# Patient Record
Sex: Female | Born: 1985 | Race: White | Hispanic: No | Marital: Married | Smoking: Never smoker
Health system: Southern US, Academic
[De-identification: ages and names within clinical notes are randomized; demographics above are authoritative.]

## PROBLEM LIST (undated history)

## (undated) ENCOUNTER — Emergency Department (HOSPITAL_BASED_OUTPATIENT_CLINIC_OR_DEPARTMENT_OTHER): Admission: EM | Payer: Self-pay | Source: Home / Self Care

## (undated) DIAGNOSIS — G43909 Migraine, unspecified, not intractable, without status migrainosus: Secondary | ICD-10-CM

## (undated) DIAGNOSIS — I10 Essential (primary) hypertension: Secondary | ICD-10-CM

## (undated) DIAGNOSIS — K5090 Crohn's disease, unspecified, without complications: Secondary | ICD-10-CM

## (undated) HISTORY — PX: HX HYSTERECTOMY: SHX81

---
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
## 2017-05-26 ENCOUNTER — Telehealth (HOSPITAL_BASED_OUTPATIENT_CLINIC_OR_DEPARTMENT_OTHER): Payer: Self-pay | Admitting: Psychiatry

## 2018-01-06 DIAGNOSIS — K5190 Ulcerative colitis, unspecified, without complications: Secondary | ICD-10-CM | POA: Insufficient documentation

## 2019-05-10 DIAGNOSIS — Z780 Asymptomatic menopausal state: Secondary | ICD-10-CM | POA: Insufficient documentation

## 2020-12-24 IMAGING — CR XRAY HAND MINIMUM 3 VIEWS LT
1 series · 3 of 3 positions shown · non-contrast
Comparison: None.

﻿EXAM:  79099      XRAY HAND MINIMUM 3 VIEWS LT
INDICATION: Stiffness of middle and ring fingers.  No history of trauma.

[Series 1: view not recorded · 0.17mm/px · 3 of 3 slices shown]
[im 1/3]
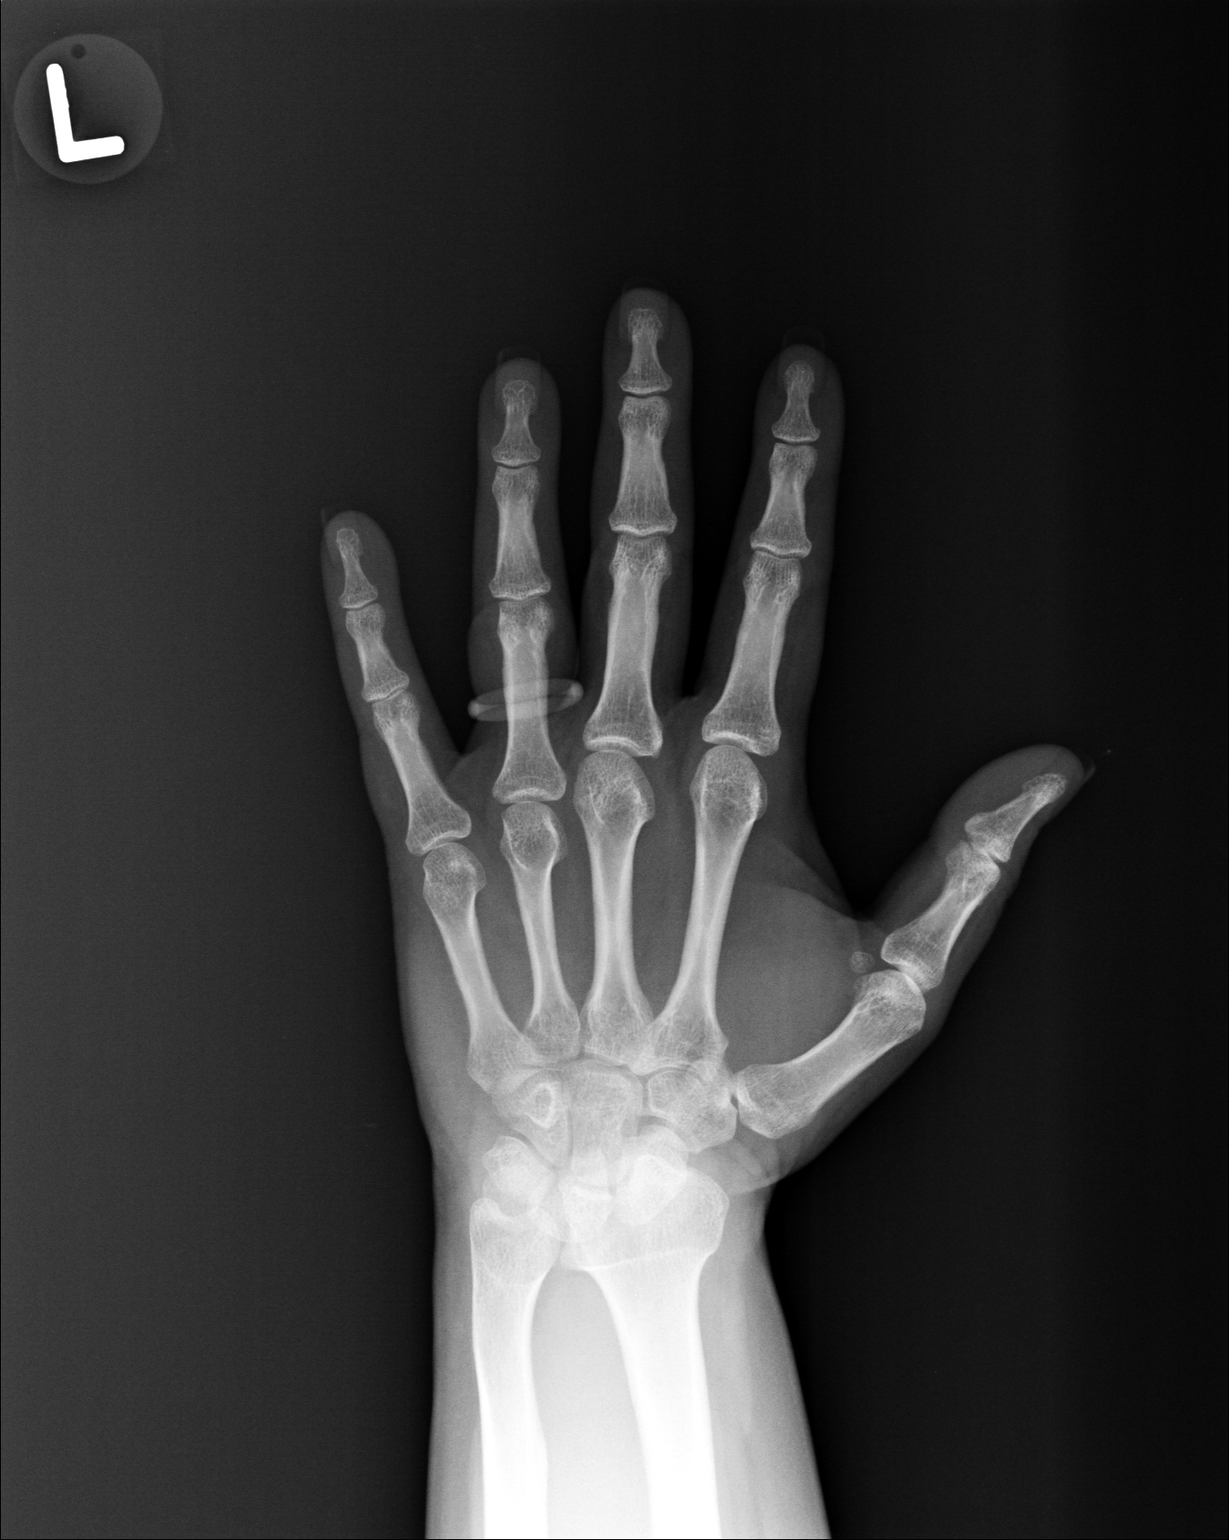
[im 2/3]
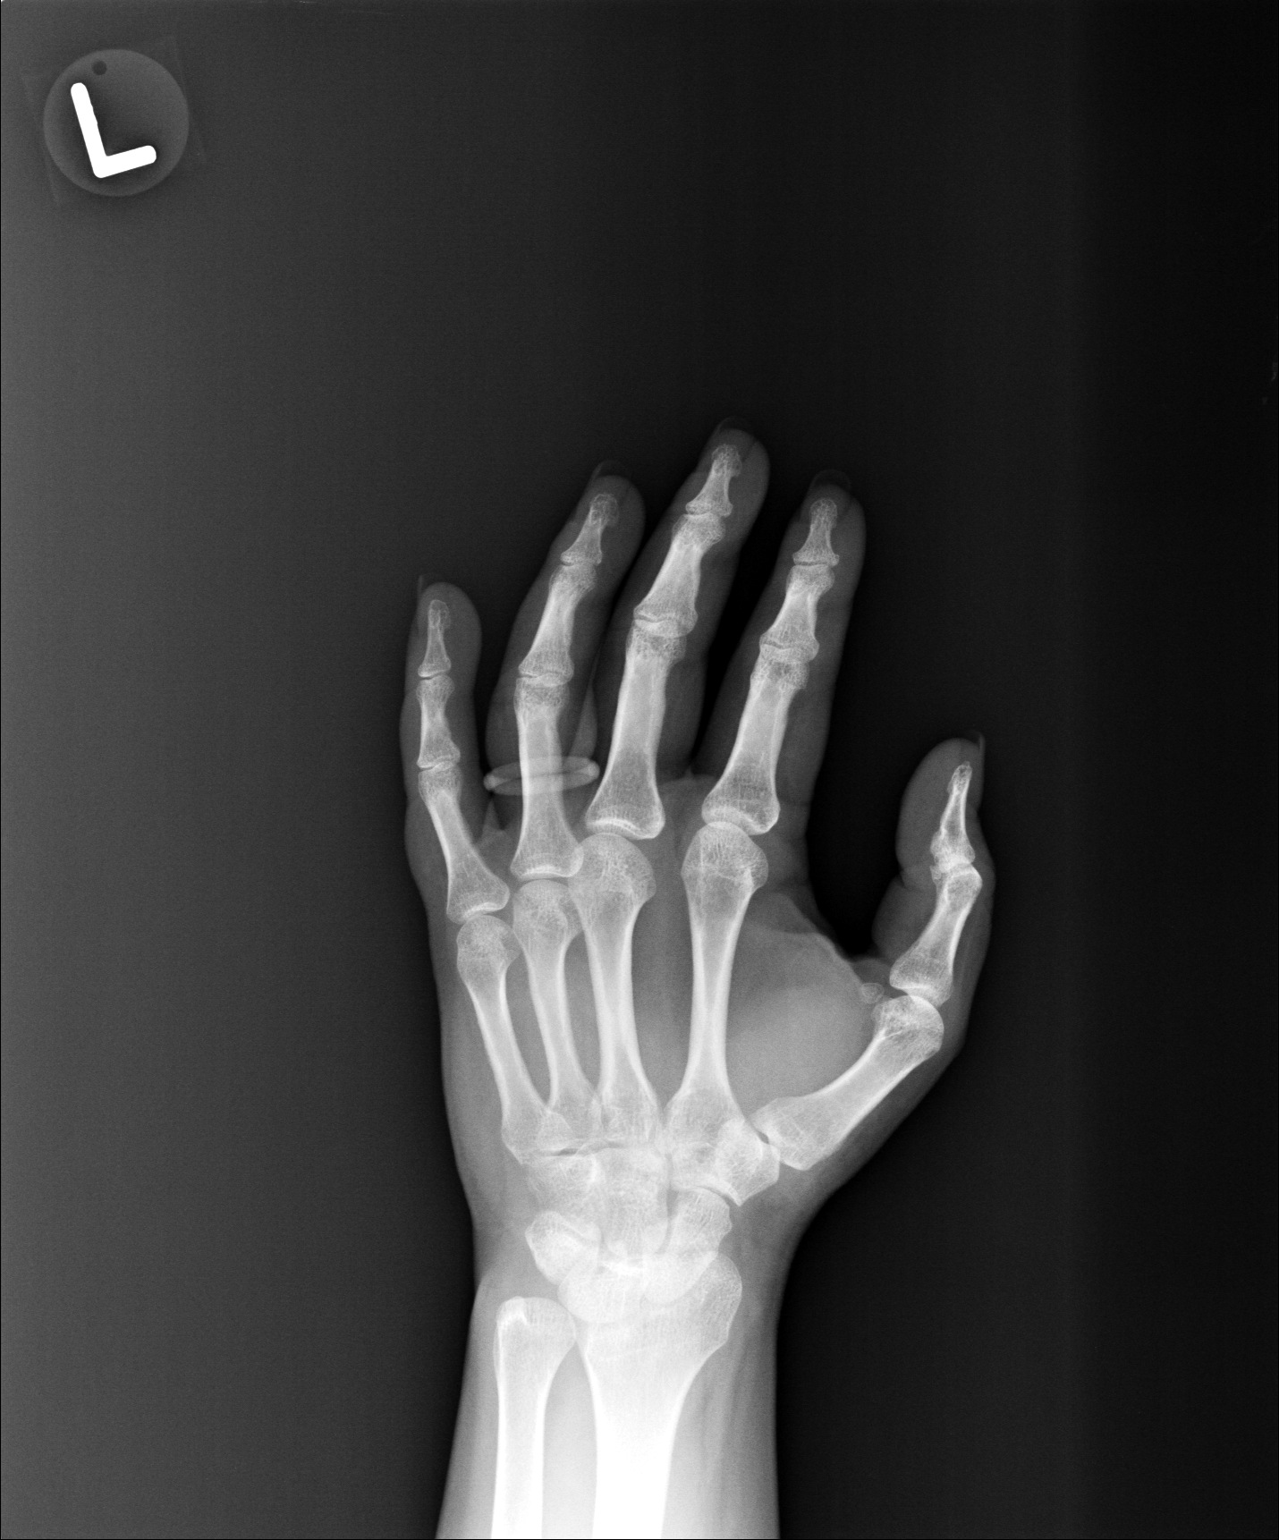
[im 3/3]
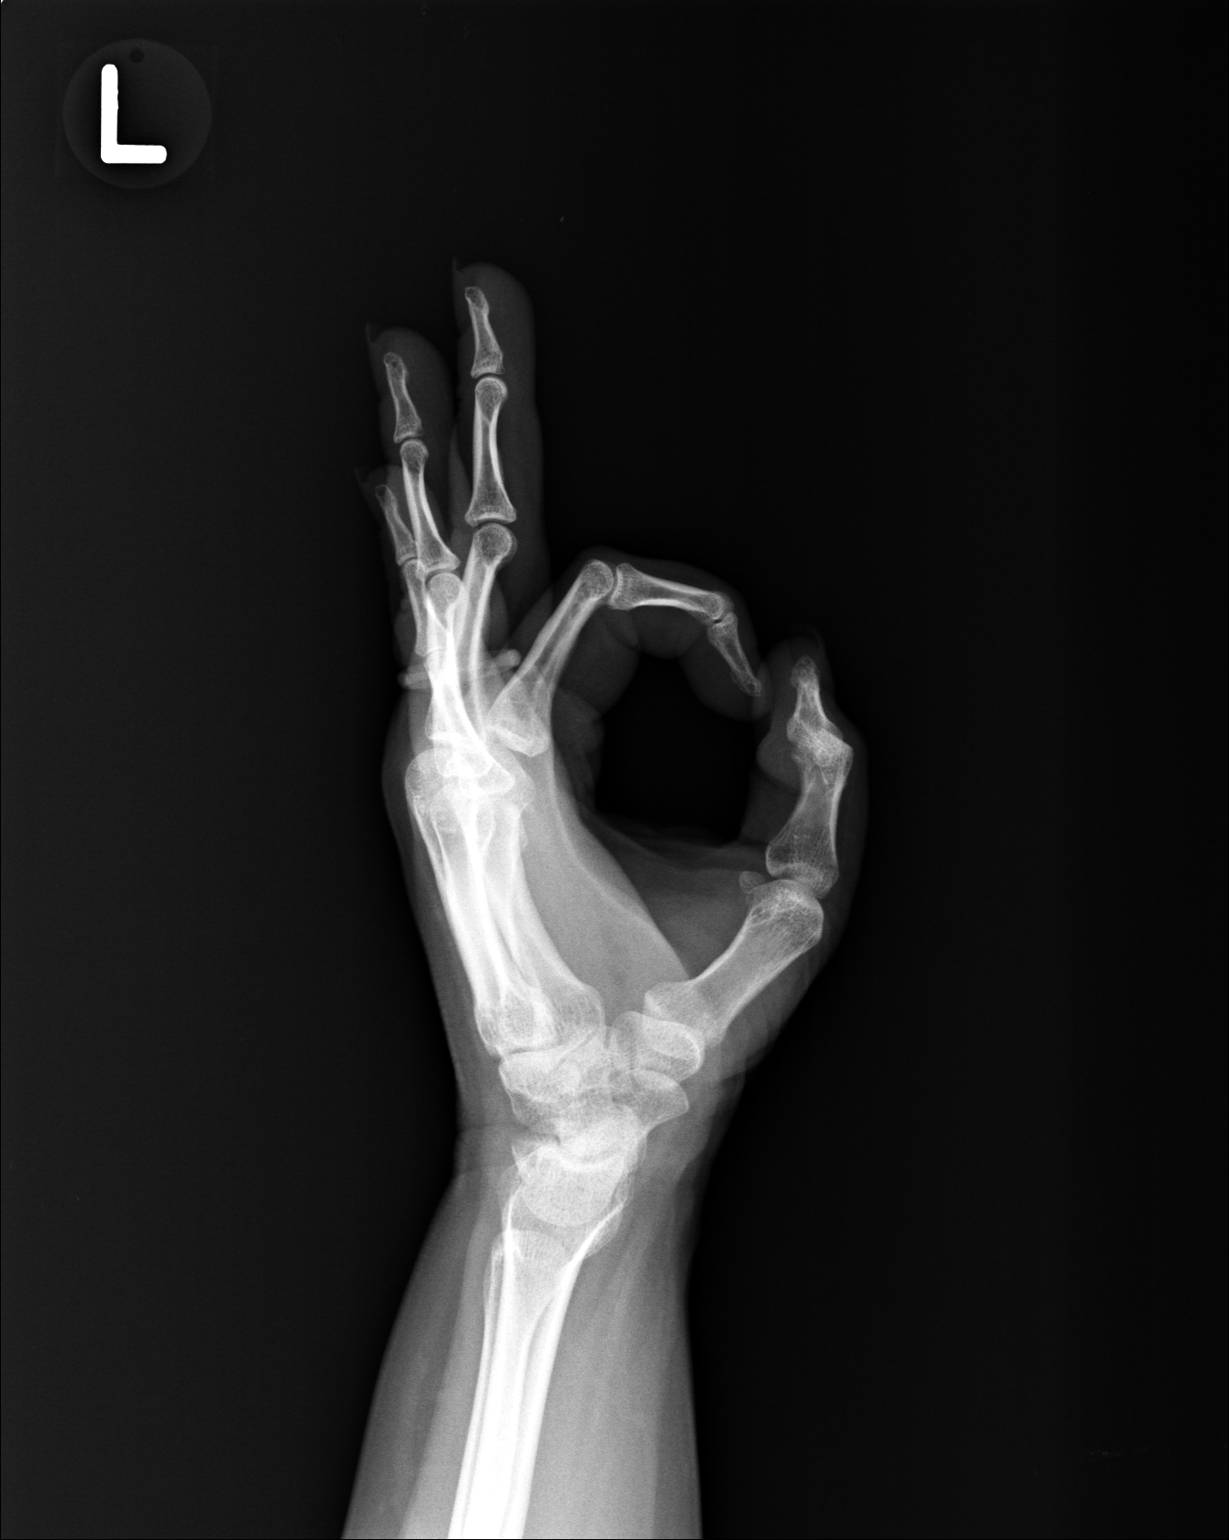

[3 of 3 positions shown; findings below may reference images not displayed]

FINDINGS: No acute bony lesions are seen.  No radiographic evidence of rheumatoid arthritis.  The interphalangeal joints are normal.  Soft tissues are unremarkable.
IMPRESSION: No significant plain radiographic abnormalities of right hand particularly fingers.  Interphalangeal joints are normal.

## 2021-09-28 ENCOUNTER — Encounter (HOSPITAL_BASED_OUTPATIENT_CLINIC_OR_DEPARTMENT_OTHER): Payer: Self-pay

## 2021-09-28 ENCOUNTER — Emergency Department
Admission: EM | Admit: 2021-09-28 | Discharge: 2021-09-28 | Disposition: A | Discharge status: Home / Self Care | Payer: Medicaid Other | Attending: Family | Admitting: Family

## 2021-09-28 ENCOUNTER — Other Ambulatory Visit: Payer: Self-pay

## 2021-09-28 ENCOUNTER — Emergency Department (EMERGENCY_DEPARTMENT_HOSPITAL): Payer: Medicaid Other

## 2021-09-28 DIAGNOSIS — R300 Dysuria: Secondary | ICD-10-CM | POA: Insufficient documentation

## 2021-09-28 DIAGNOSIS — R160 Hepatomegaly, not elsewhere classified: Secondary | ICD-10-CM | POA: Insufficient documentation

## 2021-09-28 DIAGNOSIS — Z6835 Body mass index (BMI) 35.0-35.9, adult: Secondary | ICD-10-CM

## 2021-09-28 DIAGNOSIS — R109 Unspecified abdominal pain: Secondary | ICD-10-CM | POA: Insufficient documentation

## 2021-09-28 HISTORY — DX: Migraine, unspecified, not intractable, without status migrainosus: G43.909

## 2021-09-28 HISTORY — DX: Essential (primary) hypertension: I10

## 2021-09-28 HISTORY — DX: Crohn's disease, unspecified, without complications: K50.90

## 2021-09-28 LAB — URINALYSIS, MACRO/MICRO
BILIRUBIN: NEGATIVE mg/dL
BLOOD: NEGATIVE mg/dL
GLUCOSE: NEGATIVE mg/dL
KETONES: NEGATIVE mg/dL
LEUKOCYTES: NEGATIVE WBCs/uL
NITRITE: NEGATIVE
PH: 6 (ref 4.6–8.0)
PROTEIN: NEGATIVE mg/dL
SPECIFIC GRAVITY: <=1.005 (ref 1.003–1.035)
UROBILINOGEN: 0.2 mg/dL (ref 0.2–1.0)

## 2021-09-28 LAB — CBC WITH DIFF
BASOPHIL #: 0.03 10*3/uL (ref 0.00–2.50)
BASOPHIL %: 0 % (ref 0–3)
EOSINOPHIL #: 0.16 10*3/uL (ref 0.00–2.40)
EOSINOPHIL %: 2 % (ref 0–7)
HCT: 41.4 % (ref 37.0–47.0)
HGB: 14.7 g/dL (ref 12.5–16.0)
LYMPHOCYTE #: 2.09 10*3/uL — ABNORMAL LOW (ref 2.10–11.00)
LYMPHOCYTE %: 24 % — ABNORMAL LOW (ref 25–45)
MCH: 32.1 pg — ABNORMAL HIGH (ref 27.0–32.0)
MCHC: 35.5 g/dL (ref 32.0–36.0)
MCV: 90.7 fL (ref 78.0–99.0)
MONOCYTE #: 0.42 10*3/uL (ref 0.00–4.10)
MONOCYTE %: 5 % (ref 0–12)
MPV: 7.2 fL — ABNORMAL LOW (ref 7.4–10.4)
NEUTROPHIL #: 5.86 10*3/uL (ref 4.10–29.00)
NEUTROPHIL %: 68 % (ref 40–76)
PLATELETS: 305 10*3/uL (ref 140–440)
RBC: 4.57 10*6/uL (ref 4.20–5.40)
RDW: 14.5 % (ref 11.6–14.8)
WBC: 8.6 10*3/uL (ref 4.0–10.5)

## 2021-09-28 LAB — COMPREHENSIVE METABOLIC PANEL, NON-FASTING
ALBUMIN/GLOBULIN RATIO: 0.9 (ref 0.8–1.4)
ALBUMIN: 3.6 g/dL (ref 3.4–5.0)
ALKALINE PHOSPHATASE: 106 U/L (ref 46–116)
ALT (SGPT): 34 U/L (ref <=78)
ANION GAP: 10 mmol/L (ref 10–20)
AST (SGOT): 23 U/L (ref 15–37)
BILIRUBIN TOTAL: 0.3 mg/dL (ref 0.2–1.0)
BUN/CREA RATIO: 7
BUN: 6 mg/dL — ABNORMAL LOW (ref 7–18)
CALCIUM, CORRECTED: 9.1 mg/dL
CALCIUM: 8.7 mg/dL (ref 8.5–10.1)
CHLORIDE: 103 mmol/L (ref 98–107)
CO2 TOTAL: 26 mmol/L (ref 21–32)
CREATININE: 0.84 mg/dL (ref 0.55–1.02)
ESTIMATED GFR: 92 mL/min/{1.73_m2} (ref >59)
GLOBULIN: 4.1
GLUCOSE: 82 mg/dL (ref 74–106)
OSMOLALITY, CALCULATED: 274 mOsm/kg (ref 270–290)
POTASSIUM: 3.9 mmol/L (ref 3.5–5.1)
PROTEIN TOTAL: 7.7 g/dL (ref 6.4–8.2)
SODIUM: 139 mmol/L (ref 136–145)

## 2021-09-28 MED ORDER — KETOROLAC 60 MG/2 ML INTRAMUSCULAR SOLUTION
60.0000 mg | INTRAMUSCULAR | Status: DC
Start: 2021-09-28 — End: 2021-09-28

## 2021-09-28 MED ORDER — KETOROLAC 10 MG TABLET
10.0000 mg | ORAL_TABLET | Freq: Four times a day (QID) | ORAL | 0 refills | Status: AC | PRN
Start: 2021-09-28 — End: 2021-10-03

## 2021-09-28 MED ORDER — KETOROLAC 60 MG/2 ML INTRAMUSCULAR SOLUTION
INTRAMUSCULAR | Status: AC
Start: 2021-09-28 — End: 2021-09-28
  Filled 2021-09-28: qty 2

## 2021-09-28 MED ORDER — KETOROLAC 60 MG/2 ML INTRAMUSCULAR SOLUTION
60.0000 mg | INTRAMUSCULAR | Status: AC
Start: 2021-09-28 — End: 2021-09-28
  Administered 2021-09-28: 60 mg via INTRAMUSCULAR

## 2021-09-28 MED ORDER — CEFDINIR 300 MG CAPSULE
300.0000 mg | ORAL_CAPSULE | Freq: Two times a day (BID) | ORAL | 0 refills | Status: DC
Start: 2021-09-28 — End: 2021-10-14

## 2021-09-28 NOTE — ED Nurses Note (Signed)
Patient instructed on discharge and follow up instructions including Rx x 2. Verbalized understanding.

## 2021-09-28 NOTE — Discharge Instructions (Addendum)
Please follow up. It is unclear what is causing pain, you may have passed stone. I dont believe this relates to pelvic congestion syndrome given the hysterectomy. Please have close follow up. Often after a stone is passed you get an infection.

## 2021-09-28 NOTE — ED Provider Notes (Signed)
[facility] Emergency Department  ED Primary Provider Note  History of Present Illness   Chief Complaint   Patient presents with   . Urinary Pain     Ongoing 3 days     Arrival: The patient arrived by Car    Elizabeth Mathis is a 36 y.o. female who had concerns including Urinary Pain.  Rt flank pain woke her from sleep to RLQ, urgency to urinate. Sx interm no hx of stone but thinks  It.  may be.     Review of Systems   Constitutional: No fever, chills or weakness   Skin: No rash or diaphoresis  HENT: No headaches, or congestion  Eyes: No vision changes or photophobia   Cardio: No chest pain, palpitations or leg swelling   Respiratory: No cough, wheezing or SOB  GI:  No nausea, vomiting or stool changes  GU:  +dysuria ,no  hematuria, + increased frequency  MSK: No muscle aches, joint + back pain  Neuro: No seizures, LOC, numbness, tingling, or focal weakness  Psychiatric: No depression, SI or substance abuse  All other systems reviewed and are negative.    Historical Data   History Reviewed This Encounter:  All noted and reviewed.   Physical Exam   ED Triage Vitals [09/28/21 1737]   BP (Non-Invasive) (!) 131/93   Heart Rate (!) 104   Respiratory Rate 18   Temperature 37.1 C (98.8 F)   SpO2 100 %   Weight 97.5 kg (215 lb)   Height 1.651 m (_0 )       Constitutional:  36 y.o. female who appears in no distress. Normal color, no cyanosis.   HENT:   Head: Normocephalic and atraumatic.   Mouth/Throat: Oropharynx is clear and moist.   Eyes: EOMI, PERRL   Neck: Trachea midline. Neck supple.  Cardiovascular: RRR, No murmurs, rubs or gallops. Intact distal pulses.  Pulmonary/Chest: BS equal bilaterally. No respiratory distress. No wheezes, rales or chest tenderness.   Abdominal: Bowel sounds present and normal. Abdomen soft, + suprapubic  tenderness, no rebound and no guarding.  Back: No midline spinal tenderness, no paraspinal tenderness, rt + CVA tenderness.            Musculoskeletal: No edema, tenderness or deformity.  Skin: warm and dry. No rash, erythema, pallor or cyanosis  Psychiatric: normal mood and affect. Behavior is normal.   Neurological: Patient keenly alert and responsive, easily able to raise eyebrows, facial muscles/expressions symmetric, speaking in fluent sentences, moving all extremities equally and fully, normal gait  Patient Data     Labs Ordered/Reviewed   COMPREHENSIVE METABOLIC PANEL, NON-FASTING - Abnormal; Notable for the following components:       Result Value    BUN 6 (*)     All other components within normal limits    Narrative:     Estimated Glomerular Filtration Rate (eGFR) is calculated using the CKD-EPI (2021) equation, intended for patients 45 years of age and older. If gender is not documented or "unknown", there will be no eGFR calculation.   CBC WITH DIFF - Abnormal; Notable for the following components:    MCH 32.1 (*)     MPV 7.2 (*)     LYMPHOCYTE % 24 (*)     LYMPHOCYTE # 2.09 (*)     All other components within normal limits   URINALYSIS, MACRO/MICRO - Normal   URINALYSIS WITH REFLEX MICROSCOPIC AND CULTURE IF POSITIVE    Narrative:  The following orders were created for panel order URINALYSIS WITH REFLEX MICROSCOPIC AND CULTURE IF POSITIVE.  Procedure                               Abnormality         Status                     ---------                               -----------         ------                     URINALYSIS, MACRO/MICRO[[id number]]      Normal              Final result                 Please view results for these tests on the individual orders.   CBC/DIFF    Narrative:     The following orders were created for panel order CBC/DIFF.  Procedure                               Abnormality         Status                     ---------                               -----------         ------                     CBC WITH VXYI[[id number]]                Abnormal            Final result                 Please view results for  these tests on the individual orders.     No orders to display     Medical Decision Making        MDM  ED Course as of 09/28/21 2057   Mon Sep 28, 2021   2039 Ct pending.    stone UTI.       Medications Administered in the ED   ketorolac (TORADOL) 65m/2 mL IM injection (60 mg IntraMUSCULAR Given 09/28/21 1825)     Clinical Impression   Right flank pain (Primary)   Dysuria       Disposition: Discharged

## 2021-09-28 NOTE — ED Triage Notes (Signed)
For last 3 days having urinary problems. Scant urine flow. Pain with urination noted. No visible blood. [facility]

## 2021-10-13 DIAGNOSIS — K5090 Crohn's disease, unspecified, without complications: Secondary | ICD-10-CM | POA: Insufficient documentation

## 2021-10-14 ENCOUNTER — Ambulatory Visit (INDEPENDENT_AMBULATORY_CARE_PROVIDER_SITE_OTHER): Payer: Medicaid Other | Admitting: Family

## 2021-10-14 ENCOUNTER — Other Ambulatory Visit: Payer: Self-pay

## 2021-10-14 ENCOUNTER — Encounter (INDEPENDENT_AMBULATORY_CARE_PROVIDER_SITE_OTHER): Payer: Self-pay | Admitting: Family

## 2021-10-14 DIAGNOSIS — Z6836 Body mass index (BMI) 36.0-36.9, adult: Secondary | ICD-10-CM

## 2021-10-14 DIAGNOSIS — N3289 Other specified disorders of bladder: Secondary | ICD-10-CM

## 2021-10-14 DIAGNOSIS — R3989 Other symptoms and signs involving the genitourinary system: Secondary | ICD-10-CM

## 2021-10-14 MED ORDER — SOLIFENACIN 5 MG TABLET
5.0000 mg | ORAL_TABLET | Freq: Every day | ORAL | 1 refills | Status: AC
Start: 2021-10-14 — End: 2021-12-13

## 2021-10-14 NOTE — Progress Notes (Signed)
UROLOGY, [facility]  [address] [place] [address]      History and Physical Notes     Name: Elizabeth Mathis MRN:  [id number]   Date: 10/14/2021 Age/DOB:35 y.o. [birth year]        Name: Elizabeth Mathis                       Date of Birth: 02/01/1986   MRN:  [id number]                         Date of visit: 10/14/2021     PCP: Jed Limerick, DO   Referring Provider: Pcp, No  No address on file     HPI:  Elizabeth Mathis is a 36 y.o. female who presents with Kidney Stones (Patient was seen in the ER and was told she may have had a stone./Patient complains of bladder pain and spasms.)   to clinic.      CT without IV contrast in the ER showed mild hepatomegaly and trace fluid in the deep pelvis.     She has Hx of endometriosis and underwent hysterectomy 04/2019 through [facility] in [place]. She has painful urination, bladder spasms and bladder pain. No hematuria. She has urinary urgency. She also has vaginal pain. PMH includes Crohns.     Past Medical History  Current Outpatient Medications   Medication Sig   . amLODIPine (NORVASC) 2.5 mg Oral Tablet Take 1 Tablet (2.5 mg total) by mouth Once a day   . cyclobenzaprine (FLEXERIL) 10 mg Oral Tablet Take 1 Tablet (10 mg total) by mouth Twice per day as needed for Muscle spasms   . Esterified Estrogens 2.5 mg Oral Tablet Take 1 Tablet (2.5 mg total) by mouth Once a day   . linaCLOtide (LINZESS) 145 mcg Oral Capsule Take 1 Capsule (145 mcg total) by mouth Once a day     Allergies   Allergen Reactions   . Latex, Natural Rubber    . Sulfamethoxazole-Trimethoprim    . Sulfa (Sulfonamides) Nausea/ Vomiting     Past Medical History:   Diagnosis Date   . Crohn disease (CMS HCC)    . HTN (hypertension)    . Migraines          Past Surgical History:   Procedure Laterality Date   . HX CESAREAN SECTION     . HX HYSTERECTOMY           Family Medical History:     Problem Relation (Age of Onset)    Cirrhosis Father    Diabetes Other    Hypertension (High Blood Pressure)  Other          Social History     Socioeconomic History   . Marital status: Married   Tobacco Use   . Smoking status: Never   . Smokeless tobacco: Never   Vaping Use   . Vaping Use: Every day   . Substances: Nicotine, Flavoring   Substance and Sexual Activity   . Alcohol use: Never   . Drug use: Never     Social Determinants of Health     Financial Resource Strain: Low Risk    . SDOH Financial: No   Transportation Needs: Low Risk    . SDOH Transportation: No   Social Connections: Low Risk    . SDOH Social Isolation: 5 or more times a week   Intimate Partner Violence: Low Risk    .  SDOH Domestic Violence: No   Housing Stability: Low Risk    . SDOH Housing Situation: I have housing.   Marland Kitchen SDOH Housing Worry: No        Patient Active Problem List    Diagnosis Date Noted   . Bladder pain 10/14/2021   . Bladder spasm 10/14/2021        REVIEW OF SYSTEMS:   As per HPI.    Physical Exam  Constitutional:       General: She is not in acute distress.     Appearance: Normal appearance.   Pulmonary:      Effort: Pulmonary effort is normal. No respiratory distress.   Neurological:      Mental Status: She is alert.   Psychiatric:         Mood and Affect: Mood normal.       BP (!) 146/95   Pulse 85   Ht 1.651 m (5\' 5" )   Wt 99.2 kg (218 lb 9.6 oz)   SpO2 97%   BMI 36.38 kg/m         Assessment/Plan  Assessment/Plan   1. Bladder pain    2. Bladder spasm         Start solifenacin 5 mg daily and schedule cystoscopy evaluation with anesthesia.    Eber Ferrufino, FNP-C

## 2021-10-19 ENCOUNTER — Telehealth (INDEPENDENT_AMBULATORY_CARE_PROVIDER_SITE_OTHER): Payer: Self-pay | Admitting: Family

## 2021-11-03 IMAGING — MR MRI PELVIS WITHOUT AND WITH CONTRAST
5 of 9 series · 26 of 48 positions shown · IV contrast (gadavist)
Comparison: CT abdomen pelvis report from outside facility dated 09/28/2021.

﻿EXAM:  05090   MRI PELVIS WITHOUT AND WITH CONTRAST
INDICATION: Pelvic pain.
TECHNIQUE: Multiplanar multisequential MRI of the pelvis was performed without and with 10 mL of Gadavist.

[Series 7: T1 · axial · 7.0mm · 0.83mm/px · z∈[-126,+106]mm · 6 of 30 slices shown (1 of 2)]
[im 1/30]
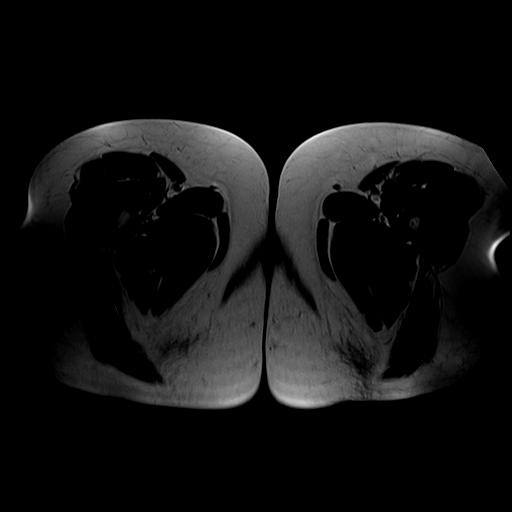
[im 6/30]
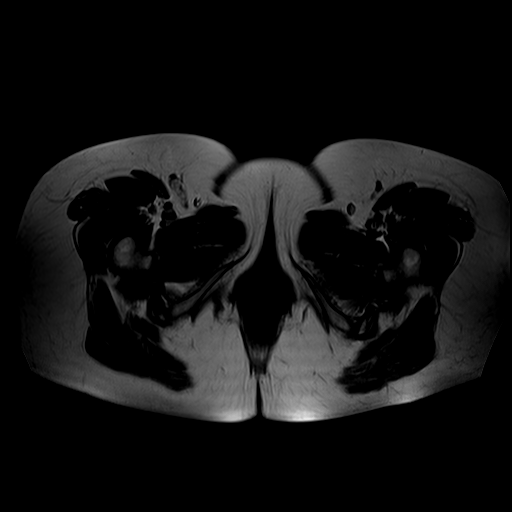
[im 12/30]
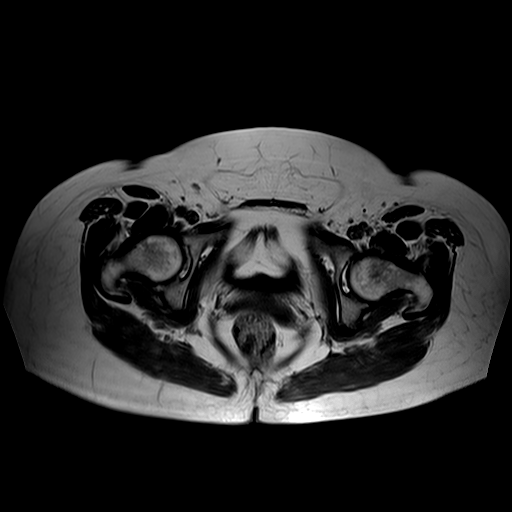
[im 18/30]
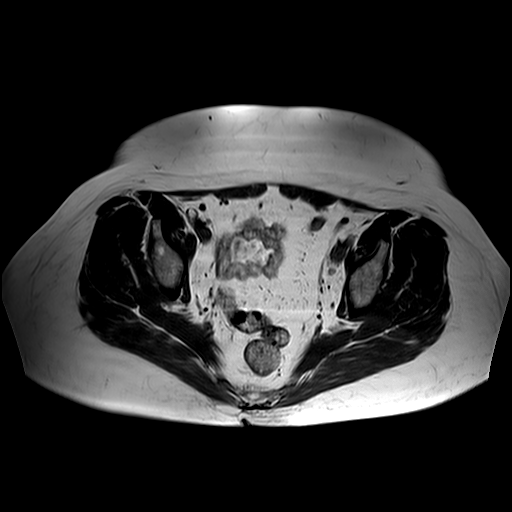
[im 24/30]
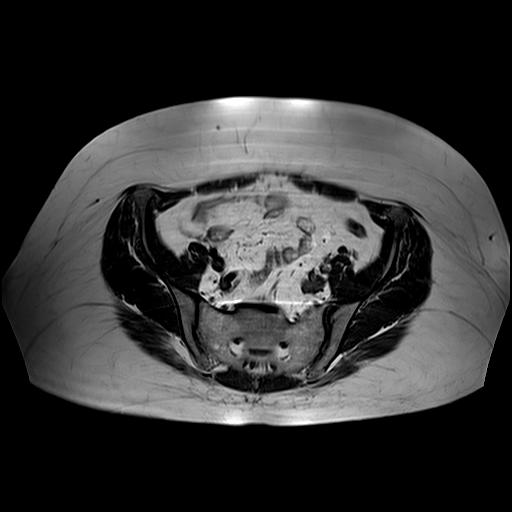
[im 30/30]
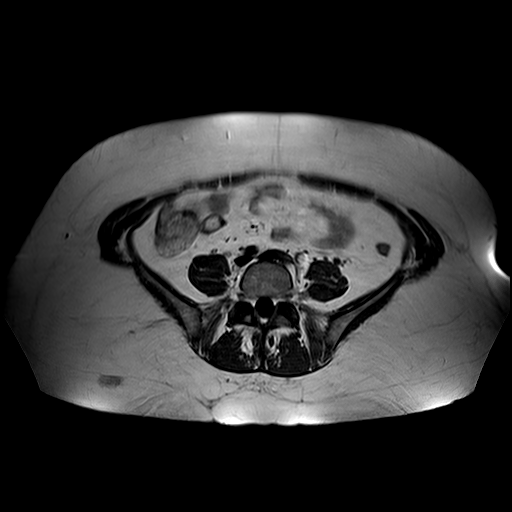

[Series 8: STIR · axial · 7.0mm · 0.83mm/px · z∈[-126,+106]mm · 6 of 30 slices shown]
[im 1/30]
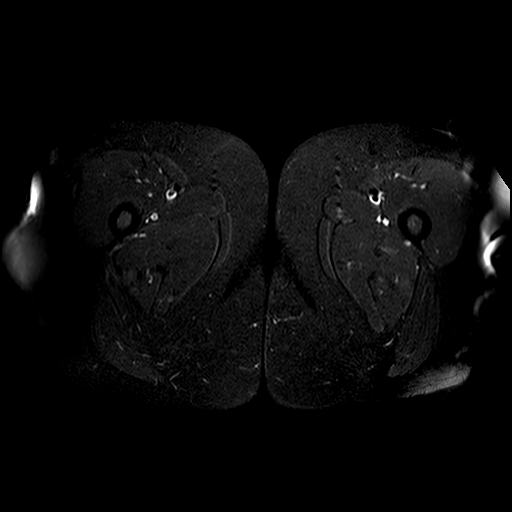
[im 6/30]
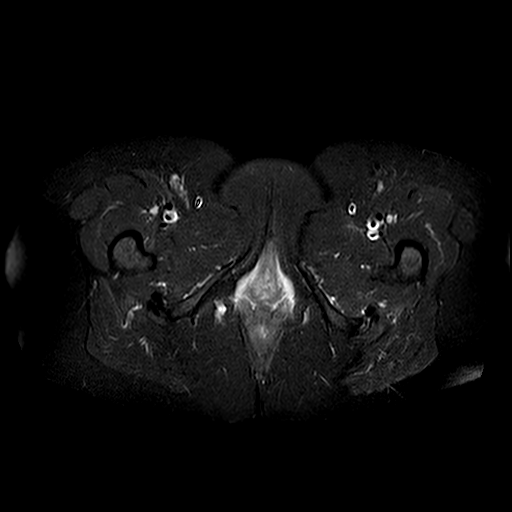
[im 12/30]
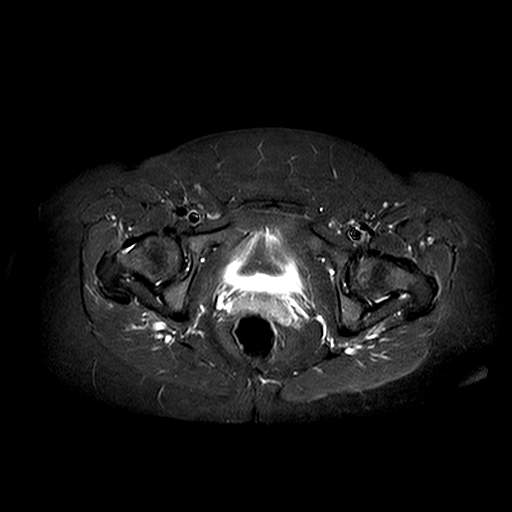
[im 18/30]
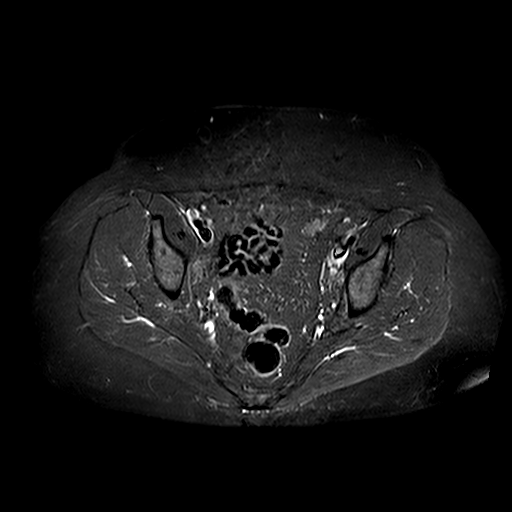
[im 24/30]
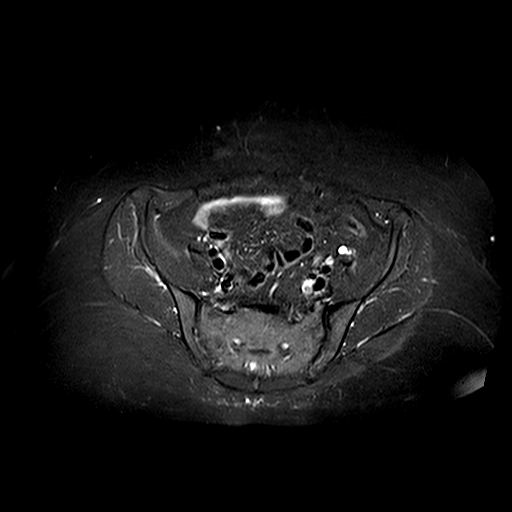
[im 30/30]
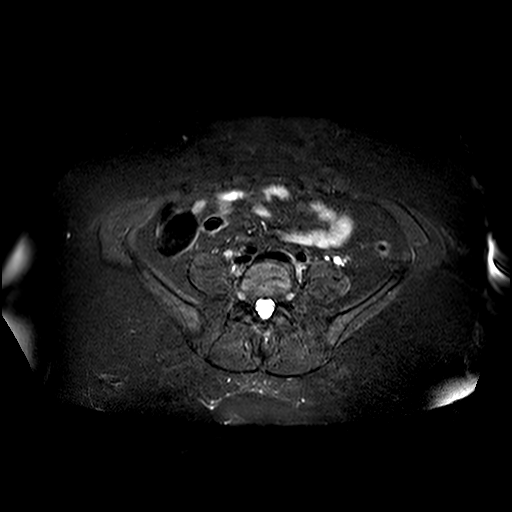

[Series 9: T2 fat-sat · axial · 7.0mm · 1.33mm/px · z∈[-126,+106]mm · 5 of 30 slices shown]
[im 1/30]
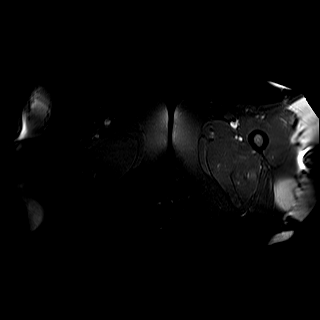
[im 8/30]
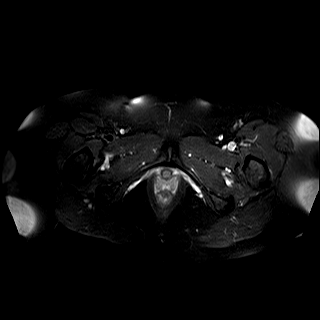
[im 15/30]
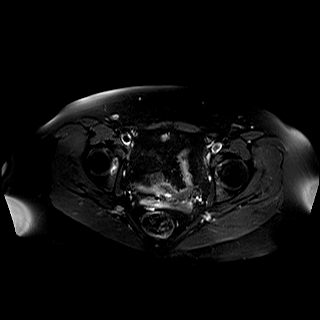
[im 22/30]
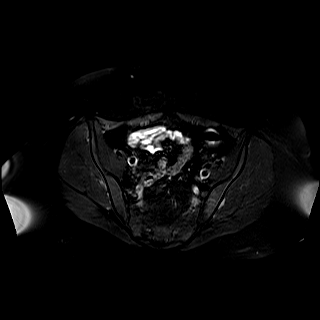
[im 30/30]
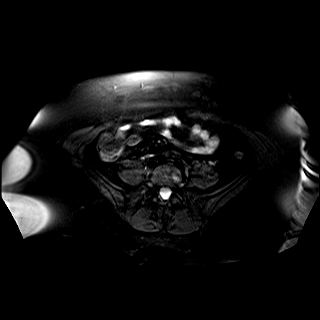

[Series 10: T1 fat-sat · axial · 7.0mm · 1.66mm/px · z∈[-126,+106]mm · 5 of 30 slices shown]
[im 1/30]
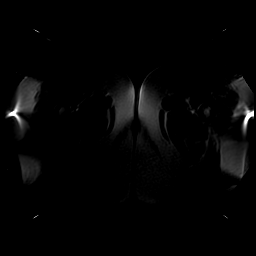
[im 8/30]
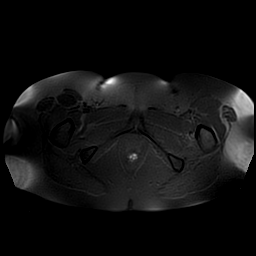
[im 15/30]
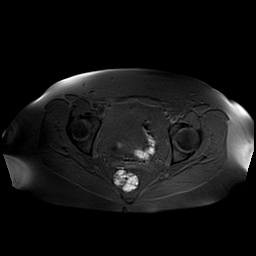
[im 22/30]
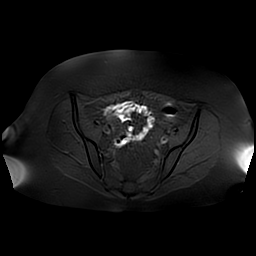
[im 30/30]
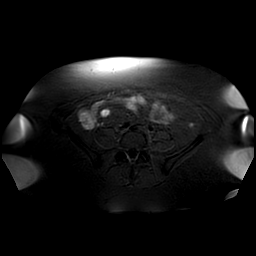

[Series 11: T1 · coronal · 7.0mm · 0.83mm/px · 4 of 28 slices shown (2 of 2)]
[im 1/28]
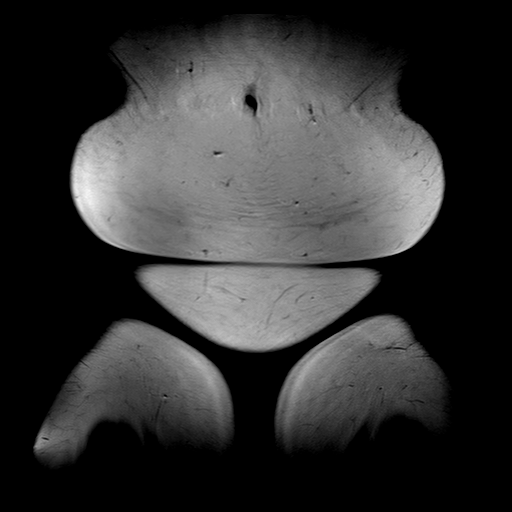
[im 7/28]
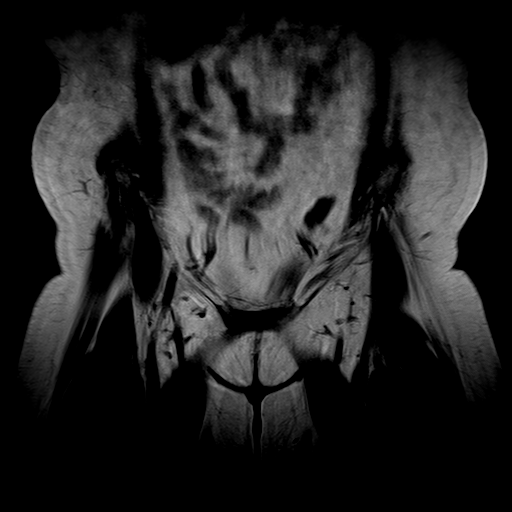
[im 14/28]
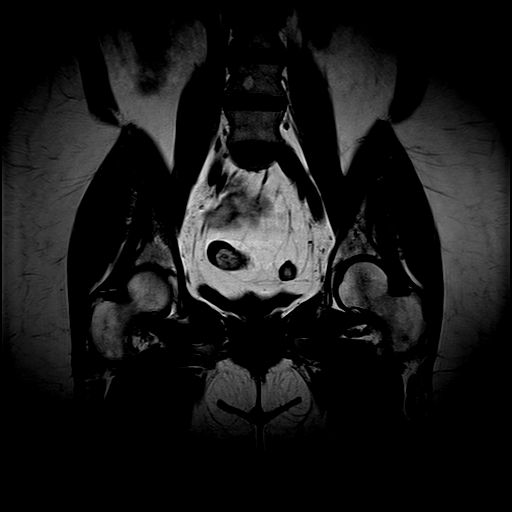
[im 21/28]
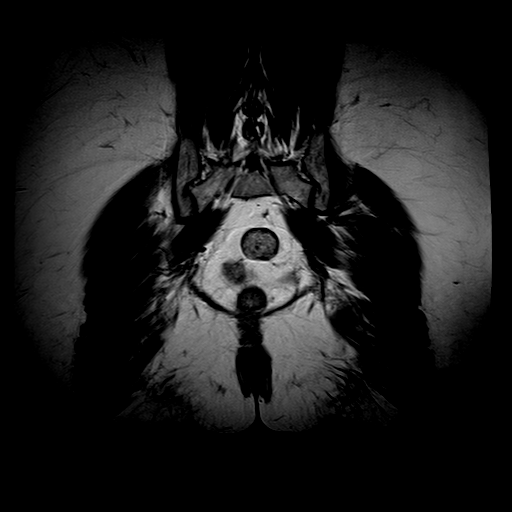

[26 of 48 positions shown; findings below may reference images not displayed]

FINDINGS: Uterus is surgically absent.  There is no adnexal mass. No pelvic adenopathy or ascites is seen.  There is no abnormal enhancement.  Bone marrow signal intensity is normal.  There is no acute fracture or subluxation. No significant degenerative changes of the hip joints are seen. There is no hip effusion on either side.  There is no evidence of iliopsoas or greater trochanteric bursitis. Muscles and soft tissues about the pelvic girdle appear unremarkable.
IMPRESSION: Unremarkable exam.

## 2021-11-10 ENCOUNTER — Telehealth (INDEPENDENT_AMBULATORY_CARE_PROVIDER_SITE_OTHER): Payer: Self-pay | Admitting: Family

## 2021-12-21 IMAGING — MR MRI BRAIN WITHOUT AND WITH CONTRAST
15 of 19 series · 34 of 48 positions shown · IV contrast (gadavist)
Comparison: None available.

﻿EXAM:  MRI BRAIN WITHOUT AND WITH CONTRAST
INDICATION: Trigeminal neuralgia.
TECHNIQUE: Multiplanar multisequential MRI of the brain was performed without and with 10 mL of Gadavist.

[Series 6: DWI · axial · 5.0mm · 1.35mm/px · z∈[-50,+76]mm · 6 of 87 slices shown (1 of 3)]
[im 1/87]
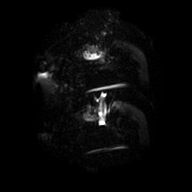
[im 18/87]
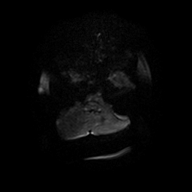
[im 35/87]
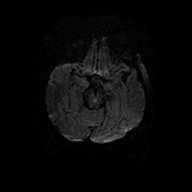
[im 52/87]
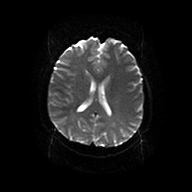
[im 69/87]
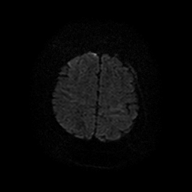
[im 87/87]
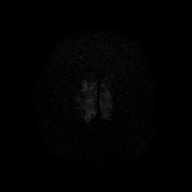

[Series 7: DWI · axial · 5.0mm · 1.35mm/px · z∈[-50,+76]mm · 2 of 22 slices shown (2 of 3)]
[im 1/22]
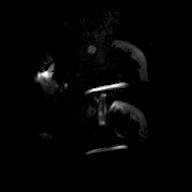
[im 22/22]
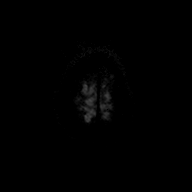

[Series 8: DWI · axial · 5.0mm · 1.35mm/px · z∈[-50,+76]mm · 2 of 22 slices shown (3 of 3)]
[im 1/22]
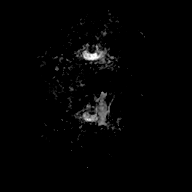
[im 22/22]
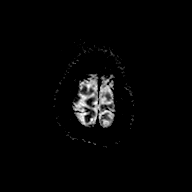

[Series 9: FLAIR · sagittal · 4.0mm · 0.75mm/px · 2 of 26 slices shown (1 of 3)]
[im 1/26]
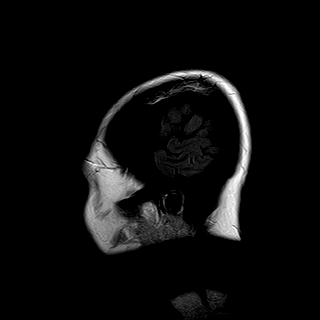
[im 26/26]
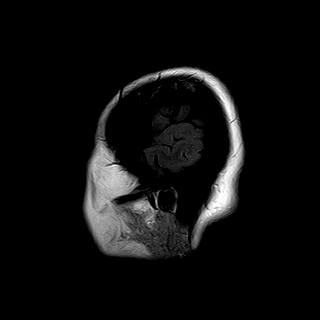

[Series 11: T1 · axial · 3.0mm · 0.47mm/px · 1 of 11 slices shown (1 of 2)]
[im 1/11]
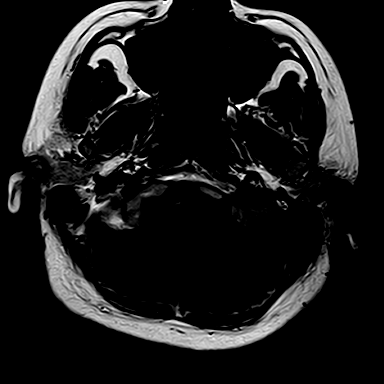

[Series 14: FLAIR · axial · 5.0mm · 0.76mm/px · z∈[-50,+76]mm · 2 of 22 slices shown (2 of 3)]
[im 1/22]
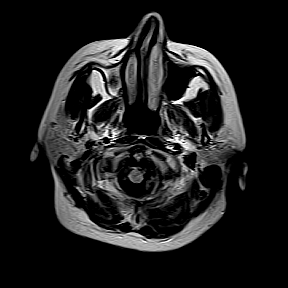
[im 22/22]
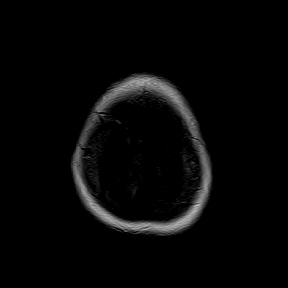

[Series 15: T1 · axial · 4.0mm · 0.69mm/px · z∈[-57,+83]mm · 3 of 36 slices shown (2 of 2)]
[im 1/36]
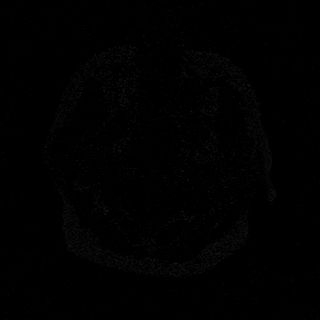
[im 18/36]
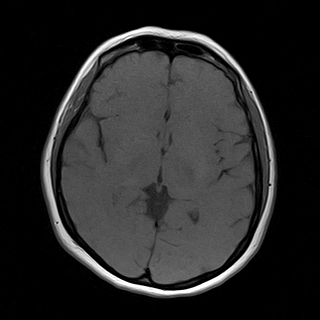
[im 36/36]
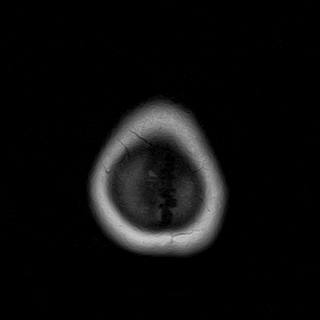

[Series 17: T2 · coronal · 5.0mm · 0.43mm/px · 2 of 28 slices shown]
[im 1/28]
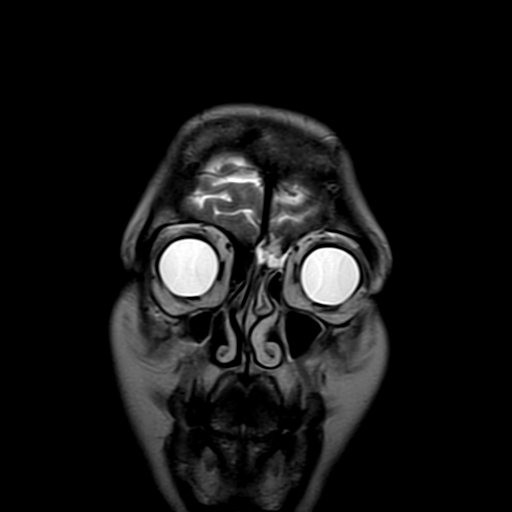
[im 28/28]
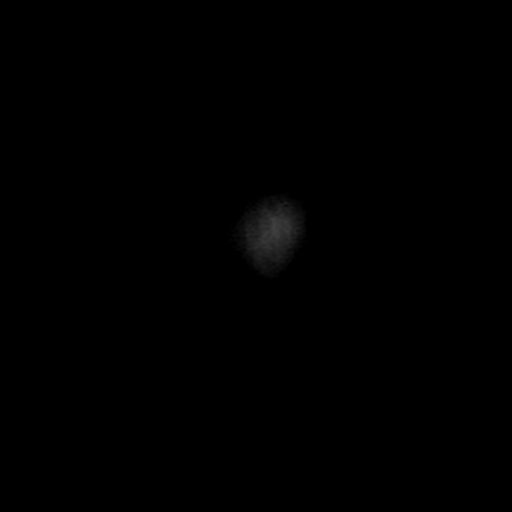

[Series 18: T1 post-contrast · axial · 3.0mm · 0.47mm/px · 1 of 11 slices shown (1 of 3)]
[im 1/11]
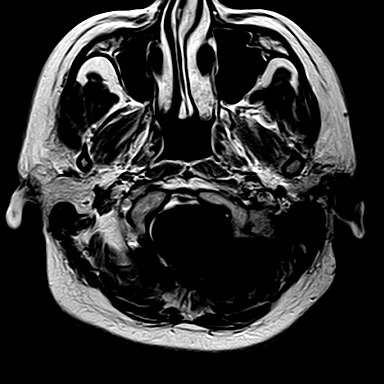

[Series 19: T1 post-contrast · coronal · 3.0mm · 0.47mm/px · 1 of 11 slices shown (2 of 3)]
[im 1/11]
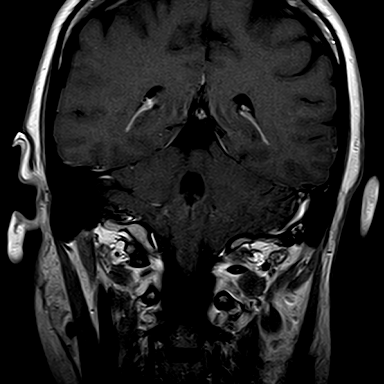

[Series 20: T1 post-contrast · axial · 4.0mm · 0.43mm/px · z∈[-57,+83]mm · 3 of 36 slices shown (3 of 3)]
[im 1/36]
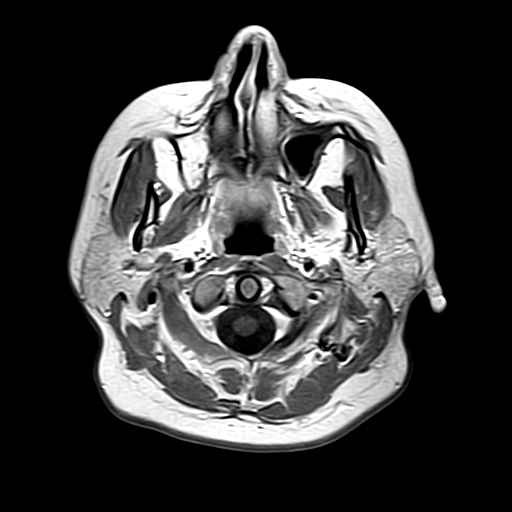
[im 18/36]
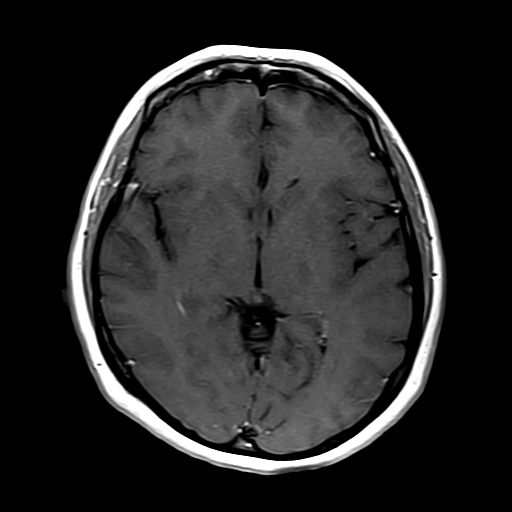
[im 36/36]
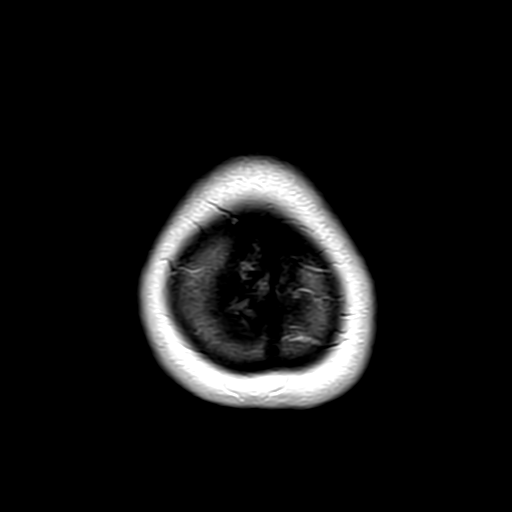

[Series 21: FLAIR · axial · 4.0mm · 0.43mm/px · z∈[-57,+83]mm · 3 of 36 slices shown (3 of 3)]
[im 1/36]
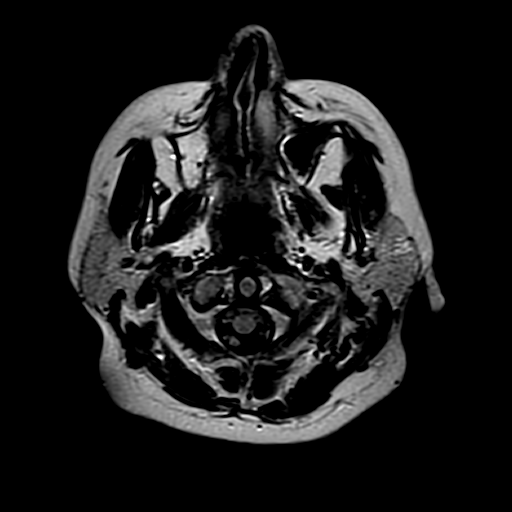
[im 18/36]
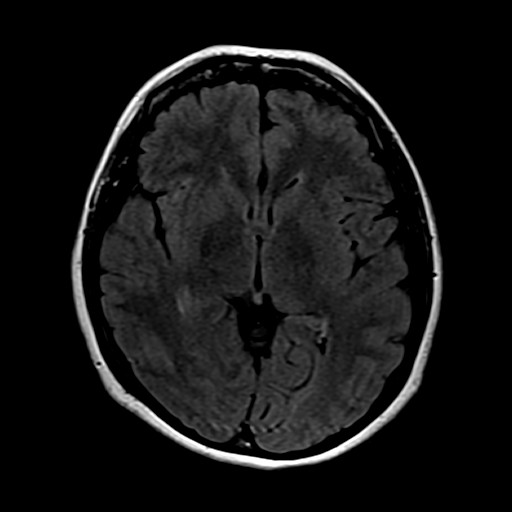
[im 36/36]
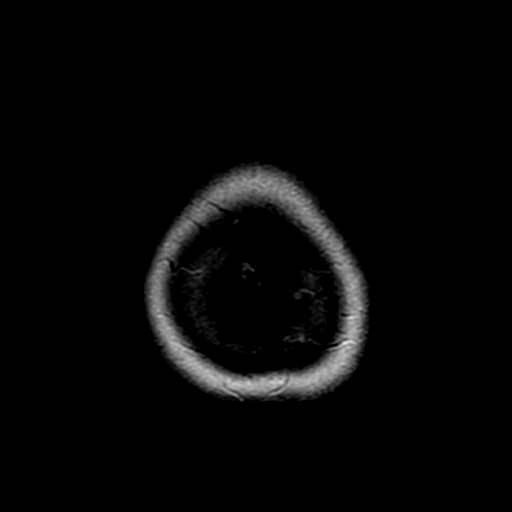

[Series 22: T1 fat-sat post-contrast · axial · 4.0mm · 0.43mm/px · z∈[-57,+83]mm · 3 of 36 slices shown (1 of 2)]
[im 1/36]
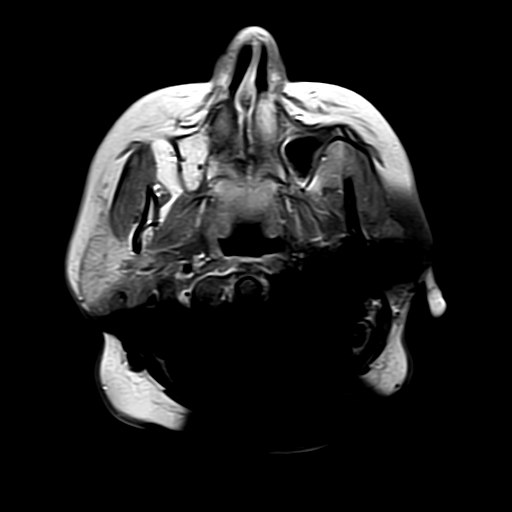
[im 18/36]
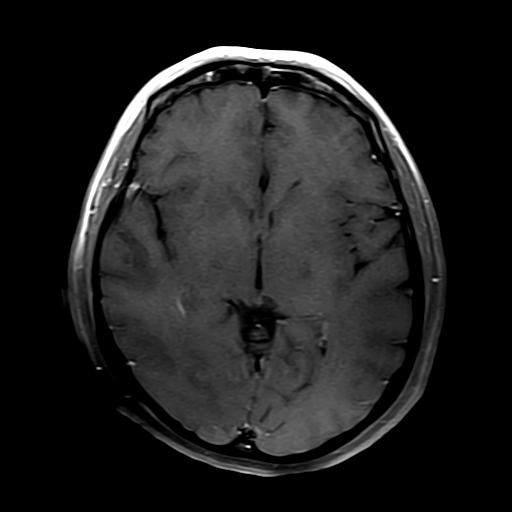
[im 36/36]
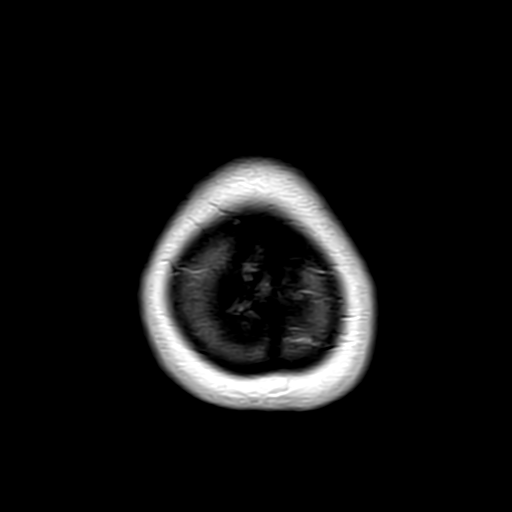

[Series 23: T1 fat-sat post-contrast · coronal · 5.0mm · 0.57mm/px · 2 of 28 slices shown (2 of 2)]
[im 1/28]
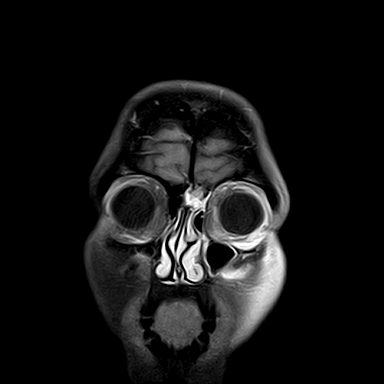
[im 28/28]
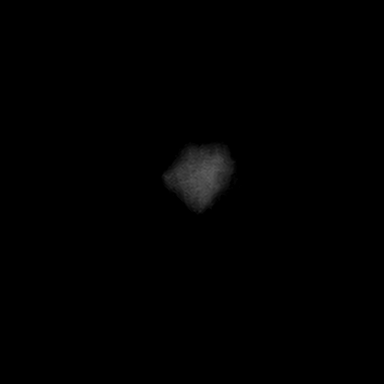

[Series 28: T1 fat-sat · axial · 4.0mm · 0.43mm/px · 1 of 27 slices shown]
[im 1/27]
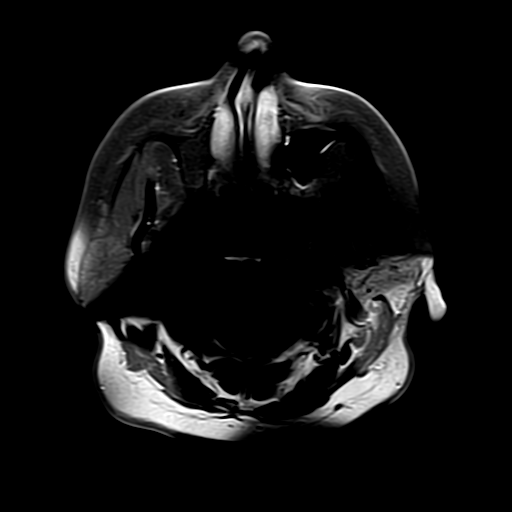

[34 of 48 positions shown; findings below may reference images not displayed]

FINDINGS: Ventricular and sulcal size is normal for the patient's age.  There is no mass effect, midline shift or intracranial hemorrhage. There is no evidence of acute infarction or prior microhemorrhages. Skull base flow voids and basal cisterns are patent.  Sagittal survey of midline structures is unremarkable. Following intravenous contrast administration, there is no abnormal parenchymal or leptomeningeal enhancement.  There are no extra-axial fluid collections. Visualized paranasal sinuses, mastoid air cells and orbital contents are unremarkable.
IMPRESSION: Unremarkable exam.

## 2022-04-20 ENCOUNTER — Other Ambulatory Visit: Payer: Self-pay

## 2022-04-20 ENCOUNTER — Ambulatory Visit (INDEPENDENT_AMBULATORY_CARE_PROVIDER_SITE_OTHER): Payer: Medicaid Other | Admitting: OTOLARYNGOLOGY

## 2022-04-20 ENCOUNTER — Encounter (INDEPENDENT_AMBULATORY_CARE_PROVIDER_SITE_OTHER): Payer: Self-pay | Admitting: OTOLARYNGOLOGY

## 2022-04-20 VITALS — Ht 65.0 in | Wt 218.0 lb

## 2022-04-20 DIAGNOSIS — J3089 Other allergic rhinitis: Secondary | ICD-10-CM

## 2022-04-20 DIAGNOSIS — F4312 Post-traumatic stress disorder, chronic: Secondary | ICD-10-CM | POA: Insufficient documentation

## 2022-04-20 DIAGNOSIS — H9190 Unspecified hearing loss, unspecified ear: Secondary | ICD-10-CM

## 2022-04-20 DIAGNOSIS — H9201 Otalgia, right ear: Secondary | ICD-10-CM

## 2022-04-20 DIAGNOSIS — F419 Anxiety disorder, unspecified: Secondary | ICD-10-CM | POA: Insufficient documentation

## 2022-04-20 NOTE — Procedures (Signed)
ENT, [facility]  [address]  [place] [address]    Procedure Note    Name: Elizabeth Mathis MRN:  [id number]   Date: 04/20/2022 Age: 36 y.o.  DOB:   1986/06/20       31231 - NASAL ENDOSCOPY DIAGNOSTIC UNILATERAL OR BILATERAL (AMB ONLY)    Performed by: Dia Sitter, DO  Authorized by: Dia Sitter, DO    Time Out:     Immediately before the procedure, a time out was called:  Yes    Patient verified:  Yes    Procedure Verified:  Yes    Site Verified:  Yes  Documentation:      Indications for procedure: Otalgia    Anesthesia: Oxymetazoline nasal spray    Description: Nasal endoscopy with rigid scope was performed with examination of the  septum, inferior, middle, and superior meatus, turbinates, sphenoethmoidal recess, and nasopharynx.     There were no polyps, pus, or granulation tissue noted.  ET orifices and nasopharynx were normal.     Findings: no masses or lesion.     The patient tolerated the procedure well.               Dia Sitter, DO
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
## 2022-04-26 ENCOUNTER — Ambulatory Visit (INDEPENDENT_AMBULATORY_CARE_PROVIDER_SITE_OTHER): Payer: Self-pay | Admitting: OTOLARYNGOLOGY

## 2022-06-15 ENCOUNTER — Encounter (INDEPENDENT_AMBULATORY_CARE_PROVIDER_SITE_OTHER): Payer: Self-pay | Admitting: OTOLARYNGOLOGY

## 2022-08-11 ENCOUNTER — Ambulatory Visit (HOSPITAL_COMMUNITY): Payer: Self-pay | Admitting: Obstetrics & Gynecology

## 2022-10-10 ENCOUNTER — Other Ambulatory Visit: Payer: Self-pay

## 2022-10-10 ENCOUNTER — Encounter (HOSPITAL_COMMUNITY): Payer: Self-pay | Admitting: Family Medicine

## 2022-10-10 ENCOUNTER — Emergency Department (HOSPITAL_COMMUNITY): Payer: Medicaid Other

## 2022-10-10 ENCOUNTER — Emergency Department
Admission: EM | Admit: 2022-10-10 | Discharge: 2022-10-10 | Disposition: A | Discharge status: Home / Self Care | Payer: Medicaid Other | Attending: Family Medicine | Admitting: Family Medicine

## 2022-10-10 DIAGNOSIS — W010XXA Fall on same level from slipping, tripping and stumbling without subsequent striking against object, initial encounter: Secondary | ICD-10-CM | POA: Insufficient documentation

## 2022-10-10 DIAGNOSIS — W19XXXA Unspecified fall, initial encounter: Secondary | ICD-10-CM

## 2022-10-10 DIAGNOSIS — M25512 Pain in left shoulder: Secondary | ICD-10-CM | POA: Insufficient documentation

## 2022-10-10 DIAGNOSIS — Y9351 Activity, roller skating (inline) and skateboarding: Secondary | ICD-10-CM | POA: Insufficient documentation

## 2022-10-10 DIAGNOSIS — M62838 Other muscle spasm: Secondary | ICD-10-CM

## 2022-10-10 DIAGNOSIS — M62830 Muscle spasm of back: Secondary | ICD-10-CM | POA: Insufficient documentation

## 2022-10-10 DIAGNOSIS — M25552 Pain in left hip: Secondary | ICD-10-CM | POA: Insufficient documentation

## 2022-10-10 MED ORDER — METHYLPREDNISOLONE SOD SUCCINATE 40 MG/ML SOLUTION FOR INJ. WRAPPER
40.0000 mg | INTRAMUSCULAR | Status: AC
Start: 2022-10-10 — End: 2022-10-10
  Administered 2022-10-10: 40 mg via INTRAMUSCULAR

## 2022-10-10 MED ORDER — KETOROLAC 30 MG/ML (1 ML) INJECTION SOLUTION
30.0000 mg | INTRAMUSCULAR | Status: AC
Start: 2022-10-10 — End: 2022-10-10
  Administered 2022-10-10: 30 mg via INTRAMUSCULAR

## 2022-10-10 MED ORDER — KETOROLAC 30 MG/ML (1 ML) INJECTION SOLUTION
INTRAMUSCULAR | Status: AC
Start: 2022-10-10 — End: 2022-10-10
  Filled 2022-10-10: qty 1

## 2022-10-10 MED ORDER — METHYLPREDNISOLONE SOD SUCCINATE 40 MG/ML SOLUTION FOR INJ. WRAPPER
INTRAMUSCULAR | Status: AC
Start: 2022-10-10 — End: 2022-10-10
  Filled 2022-10-10: qty 1

## 2022-10-10 NOTE — ED Nurses Note (Signed)
Patient discharged home with family.  AVS reviewed with patient.  A written copy of the AVS and discharge instructions was given to the patient.  Questions sufficiently answered as needed.  Patient encouraged to follow up with PCP as indicated.  In the event of an emergency, patient instructed to call 911 or go to the nearest emergency room.

## 2022-10-10 NOTE — ED Nurses Note (Signed)
Patient to ER 19 via triage c/o left shoulder, left hip, and left pelvic pain s/p fall last night while ice skating. No obvious deformities noted. No discoloration noted. Patient is awake, A&O x 4. NAD noted. Patient placed on bedside monitor (BP and pulse ox). VSS. Call light within reach.

## 2022-10-10 NOTE — ED Provider Notes (Signed)
[facility]  ED Primary Provider Note  Patient Name: Elizabeth Mathis  Patient Age: 37 y.o.  Date of Birth: 08/01/1985    Chief Complaint: Fall        History of Present Illness       Elizabeth Mathis is a 37 y.o. female who had concerns including Fall.  PATIENT PRESENTED TO THE EMERGENCY DEPARTMENT WITH COMPLAINTS OF PAIN AFTER A FALL.  PATIENT WAS ROLLERBLADING YESTERDAY, WHEN SHE TRIPPED AND FELL FORWARD ONTO HER LEFT SIDE.  PATIENT DID NOT STRIKE HER HEAD AND DID NOT LOSE CONSCIOUSNESS.  SHE COMPLAINS OF PAIN IN THE LEFT SHOULDER, LEFT HIP AND LOW BACK.  PATIENT WAS ABLE TO MOBILIZE TO THE EXAM ROOM.  SHE DENIES ANY LOSS OF SENSATION.  SHE STATES THAT SHE DID TAKE FLEXERIL AND IBUPROFEN WITH SOME IMPROVEMENT IN SYMPTOMS AT HOME.  PATIENT WANTED TO BE CHECKED OUT TO MAKE SURE THAT NOTHING WAS BROKEN.  AT TIME OF EXAMINATION, SHE WAS RESTING COMFORTABLY AND IN NO APPARENT DISTRESS.  SHE DENIES ANY BRUISING OR HEMATOMA.  PATIENT DENIES ANY FURTHER COMPLAINTS.        Review of Systems     No other overt Review of Systems are noted to be positive except noted in the HPI.      Historical Data   History Reviewed This Encounter: Medical History  Surgical History  Family History  Social History      Physical Exam   ED Triage Vitals [10/10/22 1545]   BP (Non-Invasive) (!) 132/90   Heart Rate (!) 101   Respiratory Rate 20   Temperature 36.8 C (98.2 F)   SpO2 98 %   Weight 96.6 kg (213 lb)   Height 1.676 m (5\' 6" )         Nursing notes reviewed for what could be assessed. Past Medical, Surgical, and Social history reviewed for what has been completed.     Constitutional: NAD. Well-Developed. Well Nourished.  Head: Normocephalic, atraumatic.  Mouth/Throat: no nasal discharge  Eyes: EOM grossly intact, conjunctiva normal.  Neck:  NO TENDERNESS TO PALPATION OF THE CERVICAL OR THORACIC SPINE.  MUSCLE SPASM APPRECIATED IN THE LEFT LUMBAR REGION WITHOUT SIGNIFICANT TENDERNESS TO PALPATION OF THE  LUMBAR SPINE.  Cardiovascular: Regular Rate and Rhythm, extremities well perfused.  Pulmonary/Chest: No respiratory distress. Lungs are symmetric to auscultation bilaterally.  Abdominal: Soft, non-tender, non-distended.  MSK:  TENDERNESS TO PALPATION OF THE LATERAL ASPECT OF THE LEFT SHOULDER WITHOUT ANY OBVIOUS DEFORMITY OR DECREASED SENSATION.  NO DECREASED RANGE OF MOTION.  TENDERNESS TO PALPATION ALONG THE POSTEROLATERAL ASPECT OF THE LEFT HIP WITHOUT ANY OBVIOUS DEFORMITY.  MILD DECREASED RANGE OF MOTION DUE TO PAIN.  NO LOSS OF SENSATION.  Skin: Warm, dry, and intact  Neuro: Appropriate, CN II-XII grossly intact   Psych: Cooperative   GCS: 15          Procedures      Patient Data   Labs Ordered/Reviewed - No data to display    XR HIP LEFT W PELVIS 2-3 VIEWS   Final Result by Edi, Radresults In (03/31 1644)   Old trauma or degenerative change to the left greater trochanter. No acute bony abnormality evident.         Radiologist location ID: [id number]         XR SHOULDER LEFT   Final Result by Edi, Radresults In (03/31 1639)   NEGATIVE SHOULDER SERIES  Radiologist location ID: [id number]         XR LUMBAR SPINE AP AND LAT   Final Result by Edi, Radresults In (03/31 1636)   NEGATIVE LUMBAR SPINE.               Radiologist location ID: [place] Decision Making          Medical Decision Making        Studies Assessed:  IMAGING        MDM Narrative:  PATIENT PRESENTED TO THE EMERGENCY DEPARTMENT WITH COMPLAINTS OF PAIN AFTER A FALL.  PATIENT WAS ROLLERBLADING YESTERDAY, WHEN SHE TRIPPED AND FELL FORWARD ONTO HER LEFT SIDE.  PATIENT DID NOT STRIKE HER HEAD AND DID NOT LOSE CONSCIOUSNESS.  SHE COMPLAINS OF PAIN IN THE LEFT SHOULDER, LEFT HIP AND LOW BACK.  PATIENT WAS ABLE TO MOBILIZE TO THE EXAM ROOM.  SHE DENIES ANY LOSS OF SENSATION.  SHE STATES THAT SHE DID TAKE FLEXERIL AND IBUPROFEN WITH SOME IMPROVEMENT IN SYMPTOMS AT HOME.  PATIENT WANTED TO BE CHECKED OUT TO MAKE  SURE THAT NOTHING WAS BROKEN.  AT TIME OF EXAMINATION, SHE WAS RESTING COMFORTABLY AND IN NO APPARENT DISTRESS.  SHE DENIES ANY BRUISING OR HEMATOMA.  PATIENT DENIES ANY FURTHER COMPLAINTS.  PHYSICAL EXAMINATION REVEALED TENDERNESS TO PALPATION ALONG THE LATERAL ASPECT OF THE LEFT SHOULDER.  THERE WAS NO DECREASED RANGE OF MOTION OR LOSS OF SENSATION.  THERE WAS NO OBVIOUS DEFORMITY.  PATIENT HAD TENDERNESS TO PALPATION ALONG THE POSTEROLATERAL ASPECT OF THE LEFT HIP WITHOUT ANY OBVIOUS DEFORMITY.  SLIGHT DECREASED RANGE OF MOTION DUE TO PAIN.  NO LOSS OF SENSATION.  PATIENT ALSO HAD A MUSCLE SPASM OF THE LEFT LUMBAR REGION WITHOUT SIGNIFICANT TENDERNESS TO PALPATION OF THE ACTUAL LUMBAR SPINE.  PATIENT WAS ALERT AND ORIENTED X4 AND IN NO APPARENT DISTRESS.  SOLU-MEDROL AND TORADOL WERE ADMINISTERED.  X-RAY IMAGING WAS ORDERED.  PATIENT WAS STABLE.      ED Course as of 10/10/22 1722   Sun Oct 10, 2022   1637 X-RAY IMAGING OF THE LUMBAR SPINE REVEALED:   FINDINGS:  Normal lumbar vertebral heights.  No evidence of fracture.  Disc space heights are preserved.  Normal alignment.  No spondylolisthesis.        IMPRESSION:  NEGATIVE LUMBAR SPINE   1646 X-RAY IMAGING OF THE LEFT SHOULDER REVEALED:   FINDINGS:   No fracture.  No suspicious bone lesion.  Normal alignment of the acromioclavicular and glenohumeral joints.  Soft tissues are unremarkable.        IMPRESSION:  NEGATIVE SHOULDER SERIES   1646 X-RAY IMAGING OF THE LEFT HIP REVEALED:   FINDINGS:   There is degenerative fragmentation of the greater trochanter that is nonacute in appearance.     The hip joint itself appears unremarkable.     Right hip is unremarkable.  There is calcification lateral to the right iliac bone which appears dystrophic and could be related to old trauma. This is nonacute.     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 No acute bony abnormality evident.     ON REEXAMINATION, PATIENT WAS RESTING COMFORTABLY  AND IN NO APPARENT DISTRESS.  SHE DOES ADMIT TO SOME IMPROVEMENT IN SYMPTOMS.  PATIENT WAS COUNSELED AND EDUCATED ON IMAGING FINDINGS.  ALL QUESTIONS WERE ANSWERED TO SATISFACTION.  PATIENT WAS COUNSELED AND EDUCATED ON SUPPORTIVE CARE AT HOME, INCLUDING THE USE  OF OVER-THE-COUNTER MEDICATIONS AND HER MUSCLE RELAXER.  PATIENT WAS INSTRUCTED TO FOLLOW UP WITH PCP IN THE NEXT 1-2 DAYS TO RECHECK SYMPTOMS AND WAS GIVEN VERY CLEAR INSTRUCTIONS TO RETURN TO THE EMERGENCY DEPARTMENT FOR ANY NEW OR WORSENING SYMPTOMS.  PATIENT VERBALIZED UNDERSTANDING.  PATIENT WAS SMILING AND STABLE AT TIME OF DISCHARGE.  ALL QUESTIONS WERE ANSWERED TO SATISFACTION.    Medications Administered in the ED   ketorolac (TORADOL) 30 mg/mL injection (30 mg IntraMUSCULAR Given 10/10/22 1620)   methylPREDNISolone sod succ (SOLU-medrol) 40 mg/mL injection (40 mg IntraMUSCULAR Given 10/10/22 1620)       Following the history, physical exam, and ED workup, the patient was deemed stable and suitable for discharge. The patient/caregiver was advised to return to the ED for any new or worsening symptoms. Discharge medications, and follow-up instructions were discussed with the patient/caregiver in detail, who verbalizes understanding. The patient/caregiver is in agreement and is comfortable with the plan of care.    Disposition: Discharged         Current Discharge Medication List        CONTINUE these medications - NO CHANGES were made during your visit.        Details   * cyanocobalamin 1,000 mcg Tablet  Commonly known as: VITAMIN B 12   1,000 mcg, Oral, DAILY  Refills: 0     * cyanocobalamin 1,000 mcg/mL Solution  Commonly known as: VITAMIN B12   1,000 mcg, DAILY  Refills: 0     cyclobenzaprine 10 mg Tablet  Commonly known as: FLEXERIL   10 mg, Oral, 2 TIMES DAILY PRN  Refills: 0     dicyclomine 10 mg Capsule  Commonly known as: BENTYL   10 mg, Oral, 4 TIMES DAILY  Refills: 0     Esterified Estrogens 2.5 mg Tablet   2.5 mg, Oral, DAILY  Refills: 0            * This list has 2 medication(s) that are the same as other medications prescribed for you. Read the directions carefully, and ask your doctor or other care provider to review them with you.                Follow up:   Nena Alexander  [place]  [place] [address]  [phone]    In 1 day      [facility]  [place]  [place] [phone]  [phone]    As needed, If symptoms worsen               Clinical Impression   Fall (Primary)   Pain in left shoulder   Pain in left hip   Lumbar paraspinal muscle spasm         Discharge Medication List as of 10/10/2022  4:47 PM            Thera Flake, DO

## 2022-10-10 NOTE — ED Triage Notes (Signed)
Fall yesterday shaking . Having pain to left side . Hip , shoulder and arm

## 2023-05-05 ENCOUNTER — Other Ambulatory Visit (HOSPITAL_COMMUNITY): Payer: Self-pay

## 2023-05-05 DIAGNOSIS — R079 Chest pain, unspecified: Secondary | ICD-10-CM

## 2023-05-06 ENCOUNTER — Other Ambulatory Visit (HOSPITAL_COMMUNITY): Payer: Self-pay

## 2023-05-06 ENCOUNTER — Ambulatory Visit
Admission: RE | Admit: 2023-05-06 | Discharge: 2023-05-06 | Disposition: A | Discharge status: Home / Self Care | Payer: Medicaid Other | Source: Ambulatory Visit

## 2023-05-06 ENCOUNTER — Ambulatory Visit (HOSPITAL_BASED_OUTPATIENT_CLINIC_OR_DEPARTMENT_OTHER)
Admission: RE | Admit: 2023-05-06 | Discharge: 2023-05-06 | Disposition: A | Discharge status: Home / Self Care | Payer: Medicaid Other | Source: Ambulatory Visit

## 2023-05-06 ENCOUNTER — Other Ambulatory Visit: Payer: Self-pay

## 2023-05-06 DIAGNOSIS — R9431 Abnormal electrocardiogram [ECG] [EKG]: Secondary | ICD-10-CM

## 2023-05-06 DIAGNOSIS — R0789 Other chest pain: Secondary | ICD-10-CM

## 2023-05-06 DIAGNOSIS — R079 Chest pain, unspecified: Secondary | ICD-10-CM

## 2023-05-06 LAB — ECG 12 LEAD
Atrial Rate: 77 {beats}/min
Calculated P Axis: 59 degrees
Calculated R Axis: 94 degrees
Calculated T Axis: 36 degrees
PR Interval: 152 ms
QRS Duration: 90 ms
QT Interval: 380 ms
QTC Calculation: 430 ms
Ventricular rate: 77 {beats}/min

## 2023-05-10 ENCOUNTER — Ambulatory Visit (HOSPITAL_COMMUNITY): Payer: Self-pay

## 2023-06-02 LAB — STRESS TEST - ADULT
Angina Index: 0
Baseline HR: 85 {beats}/min
Estimated workload: 10.1 METS
Exercise duration (min): 8 min
Exercise duration (sec): 45 s
Peak Diastolic BP for Stress Tests: 87 mm[Hg]
Peak Systolic BP Stress Test: 171 mm[Hg]
Post peak HR: 151 {beats}/min
RPP: 25821
Target HR: 154 {beats}/min

## 2024-01-02 ENCOUNTER — Other Ambulatory Visit (HOSPITAL_COMMUNITY): Payer: Self-pay | Admitting: Family

## 2024-01-02 DIAGNOSIS — K5900 Constipation, unspecified: Secondary | ICD-10-CM

## 2024-01-02 DIAGNOSIS — R109 Unspecified abdominal pain: Secondary | ICD-10-CM

## 2024-01-20 ENCOUNTER — Ambulatory Visit

## 2024-01-27 ENCOUNTER — Other Ambulatory Visit (HOSPITAL_COMMUNITY): Payer: Self-pay

## 2024-01-27 ENCOUNTER — Ambulatory Visit
Admission: RE | Admit: 2024-01-27 | Discharge: 2024-01-27 | Disposition: A | Discharge status: Home / Self Care | Source: Ambulatory Visit

## 2024-01-27 ENCOUNTER — Other Ambulatory Visit: Payer: Self-pay

## 2024-01-27 DIAGNOSIS — M25561 Pain in right knee: Secondary | ICD-10-CM

## 2024-01-27 DIAGNOSIS — M545 Low back pain, unspecified: Secondary | ICD-10-CM

## 2024-01-27 DIAGNOSIS — M25551 Pain in right hip: Secondary | ICD-10-CM

## 2024-01-27 DIAGNOSIS — Z9181 History of falling: Secondary | ICD-10-CM | POA: Insufficient documentation

## 2024-02-14 ENCOUNTER — Ambulatory Visit (INDEPENDENT_AMBULATORY_CARE_PROVIDER_SITE_OTHER): Admitting: Internal Medicine

## 2024-04-05 ENCOUNTER — Other Ambulatory Visit (HOSPITAL_COMMUNITY): Payer: Self-pay

## 2024-04-05 DIAGNOSIS — Z1231 Encounter for screening mammogram for malignant neoplasm of breast: Secondary | ICD-10-CM

## 2024-04-19 ENCOUNTER — Other Ambulatory Visit (HOSPITAL_COMMUNITY): Payer: Self-pay

## 2024-04-19 ENCOUNTER — Encounter (HOSPITAL_COMMUNITY): Payer: Self-pay

## 2024-04-19 DIAGNOSIS — Z13820 Encounter for screening for osteoporosis: Secondary | ICD-10-CM

## 2024-04-24 ENCOUNTER — Ambulatory Visit
Admission: RE | Admit: 2024-04-24 | Discharge: 2024-04-24 | Disposition: A | Discharge status: Home / Self Care | Source: Ambulatory Visit

## 2024-04-24 ENCOUNTER — Other Ambulatory Visit: Payer: Self-pay

## 2024-04-24 ENCOUNTER — Ambulatory Visit (HOSPITAL_BASED_OUTPATIENT_CLINIC_OR_DEPARTMENT_OTHER): Admission: RE | Admit: 2024-04-24 | Discharge: 2024-04-24 | Disposition: A | Source: Ambulatory Visit

## 2024-04-24 ENCOUNTER — Encounter (HOSPITAL_COMMUNITY): Payer: Self-pay

## 2024-04-24 DIAGNOSIS — Z13820 Encounter for screening for osteoporosis: Secondary | ICD-10-CM

## 2024-04-24 DIAGNOSIS — M8588 Other specified disorders of bone density and structure, other site: Secondary | ICD-10-CM

## 2024-04-24 DIAGNOSIS — Z1231 Encounter for screening mammogram for malignant neoplasm of breast: Secondary | ICD-10-CM | POA: Insufficient documentation

## 2024-04-25 DIAGNOSIS — Z1231 Encounter for screening mammogram for malignant neoplasm of breast: Secondary | ICD-10-CM

## 2024-05-18 ENCOUNTER — Ambulatory Visit

## 2024-05-26 ENCOUNTER — Emergency Department
Admission: EM | Admit: 2024-05-26 | Discharge: 2024-05-27 | Disposition: A | Discharge status: Home / Self Care | Attending: NURSE PRACTITIONER | Admitting: NURSE PRACTITIONER

## 2024-05-26 ENCOUNTER — Other Ambulatory Visit: Payer: Self-pay

## 2024-05-26 ENCOUNTER — Emergency Department (HOSPITAL_COMMUNITY)

## 2024-05-26 DIAGNOSIS — Z8719 Personal history of other diseases of the digestive system: Secondary | ICD-10-CM | POA: Insufficient documentation

## 2024-05-26 DIAGNOSIS — R300 Dysuria: Secondary | ICD-10-CM

## 2024-05-26 DIAGNOSIS — R350 Frequency of micturition: Secondary | ICD-10-CM | POA: Insufficient documentation

## 2024-05-26 DIAGNOSIS — R1024 Suprapubic pain: Secondary | ICD-10-CM | POA: Insufficient documentation

## 2024-05-26 DIAGNOSIS — Z8742 Personal history of other diseases of the female genital tract: Secondary | ICD-10-CM | POA: Insufficient documentation

## 2024-05-26 DIAGNOSIS — K5902 Outlet dysfunction constipation: Secondary | ICD-10-CM

## 2024-05-26 DIAGNOSIS — R399 Unspecified symptoms and signs involving the genitourinary system: Secondary | ICD-10-CM

## 2024-05-26 DIAGNOSIS — K760 Fatty (change of) liver, not elsewhere classified: Secondary | ICD-10-CM | POA: Insufficient documentation

## 2024-05-26 DIAGNOSIS — K5090 Crohn's disease, unspecified, without complications: Secondary | ICD-10-CM

## 2024-05-26 LAB — URINALYSIS, MACROSCOPIC
BILIRUBIN: NEGATIVE mg/dL
BLOOD: NEGATIVE mg/dL
GLUCOSE: NEGATIVE mg/dL
KETONES: NEGATIVE mg/dL
LEUKOCYTES: NEGATIVE WBCs/uL
NITRITE: NEGATIVE
PH: 6 (ref 5.0–9.0)
PROTEIN: NEGATIVE mg/dL
SPECIFIC GRAVITY: 1.004 (ref 1.002–1.030)
UROBILINOGEN: NORMAL mg/dL

## 2024-05-26 LAB — CBC WITH DIFF
BASOPHIL #: 0.1 x10ˆ3/uL (ref 0.00–0.10)
BASOPHIL %: 1 % (ref 0–1)
EOSINOPHIL #: 0.2 x10ˆ3/uL (ref 0.00–0.50)
EOSINOPHIL %: 2 % (ref 1–7)
HCT: 39.3 % (ref 31.2–41.9)
HGB: 13.7 g/dL (ref 10.9–14.3)
LYMPHOCYTE #: 2.8 x10ˆ3/uL (ref 1.10–3.10)
LYMPHOCYTE %: 39 % (ref 16–46)
MCH: 31.8 pg (ref 24.7–32.8)
MCHC: 34.8 g/dL (ref 32.3–35.6)
MCV: 91.5 fL (ref 75.5–95.3)
MONOCYTE #: 0.3 x10ˆ3/uL (ref 0.20–0.90)
MONOCYTE %: 4 % (ref 4–11)
MPV: 7.6 fL — ABNORMAL LOW (ref 7.9–10.8)
NEUTROPHIL #: 3.9 x10ˆ3/uL (ref 1.90–8.20)
NEUTROPHIL %: 54 % (ref 43–77)
PLATELETS: 269 x10ˆ3/uL (ref 140–440)
RBC: 4.29 x10ˆ6/uL (ref 3.63–4.92)
RDW: 12.4 % (ref 12.3–17.7)
WBC: 7.2 x10ˆ3/uL (ref 3.8–11.8)

## 2024-05-26 LAB — COMPREHENSIVE METABOLIC PANEL, NON-FASTING
ALBUMIN/GLOBULIN RATIO: 1.5 — ABNORMAL HIGH (ref 0.8–1.4)
ALBUMIN: 4.1 g/dL (ref 3.5–5.7)
ALKALINE PHOSPHATASE: 63 U/L (ref 34–104)
ALT (SGPT): 16 U/L (ref 7–52)
ANION GAP: 6 mmol/L (ref 4–13)
AST (SGOT): 14 U/L (ref 13–39)
BILIRUBIN TOTAL: 0.4 mg/dL (ref 0.3–1.0)
BUN/CREA RATIO: 17 (ref 6–22)
BUN: 16 mg/dL (ref 7–25)
CALCIUM, CORRECTED: 8.9 mg/dL (ref 8.9–10.8)
CALCIUM: 9 mg/dL (ref 8.6–10.3)
CHLORIDE: 106 mmol/L (ref 98–107)
CO2 TOTAL: 25 mmol/L (ref 21–31)
CREATININE: 0.92 mg/dL (ref 0.60–1.30)
ESTIMATED GFR: 81 mL/min/1.73mˆ2 (ref >59)
GLOBULIN: 2.8 (ref 2.0–3.5)
GLUCOSE: 133 mg/dL — ABNORMAL HIGH (ref 74–109)
OSMOLALITY, CALCULATED: 277 mosm/kg (ref 270–290)
POTASSIUM: 3.8 mmol/L (ref 3.5–5.1)
PROTEIN TOTAL: 6.9 g/dL (ref 6.4–8.9)
SODIUM: 137 mmol/L (ref 136–145)

## 2024-05-26 LAB — LIPASE: LIPASE: 20 U/L (ref 11–82)

## 2024-05-26 LAB — URINALYSIS, MICROSCOPIC
BACTERIA: NEGATIVE /HPF
RBCS: 0 /HPF (ref <4)
SQUAMOUS EPITHELIAL: 1 /HPF (ref <28)
WBCS: <1 /HPF (ref <6)

## 2024-05-26 MED ORDER — KETOROLAC 30 MG/ML (1 ML) INJECTION SOLUTION
INTRAMUSCULAR | Status: AC
Start: 2024-05-26 — End: 2024-05-26
  Filled 2024-05-26: qty 1

## 2024-05-26 MED ORDER — PHENAZOPYRIDINE 100 MG TABLET
100.0000 mg | ORAL_TABLET | ORAL | Status: AC
Start: 2024-05-26 — End: 2024-05-26
  Administered 2024-05-26: 100 mg via ORAL

## 2024-05-26 MED ORDER — KETOROLAC 30 MG/ML (1 ML) INJECTION SOLUTION
30.0000 mg | INTRAMUSCULAR | Status: AC
Start: 2024-05-26 — End: 2024-05-26
  Administered 2024-05-26: 30 mg via INTRAVENOUS

## 2024-05-26 MED ORDER — IOPAMIDOL 370 MG IODINE/ML (76 %) INTRAVENOUS SOLUTION
75.0000 mL | INTRAVENOUS | Status: AC
Start: 2024-05-26 — End: 2024-05-26
  Administered 2024-05-26: 75 mL via INTRAVENOUS

## 2024-05-26 MED ORDER — PHENAZOPYRIDINE 100 MG TABLET
ORAL_TABLET | ORAL | Status: AC
Start: 2024-05-26 — End: 2024-05-26
  Filled 2024-05-26: qty 1

## 2024-05-26 MED ORDER — SODIUM CHLORIDE 0.9 % IV BOLUS
1000.0000 mL | INJECTION | Status: AC
Start: 2024-05-26 — End: 2024-05-27
  Administered 2024-05-26: 1000 mL via INTRAVENOUS
  Administered 2024-05-27: 0 mL via INTRAVENOUS

## 2024-05-27 DIAGNOSIS — K760 Fatty (change of) liver, not elsewhere classified: Secondary | ICD-10-CM

## 2024-05-27 MED ORDER — PHENAZOPYRIDINE 100 MG TABLET
100.0000 mg | ORAL_TABLET | Freq: Three times a day (TID) | ORAL | 0 refills | Status: AC
Start: 2024-05-27 — End: ?

## 2024-05-27 MED ORDER — NAPROXEN 500 MG TABLET
500.0000 mg | ORAL_TABLET | Freq: Two times a day (BID) | ORAL | 0 refills | Status: AC
Start: 2024-05-27 — End: ?

## 2024-06-19 ENCOUNTER — Encounter (INDEPENDENT_AMBULATORY_CARE_PROVIDER_SITE_OTHER): Admitting: Physician Assistant

## 2024-07-08 ENCOUNTER — Other Ambulatory Visit: Payer: Self-pay

## 2024-07-08 ENCOUNTER — Encounter (HOSPITAL_COMMUNITY): Payer: Self-pay

## 2024-07-08 ENCOUNTER — Emergency Department
Admission: EM | Admit: 2024-07-08 | Discharge: 2024-07-08 | Disposition: A | Discharge status: Home / Self Care | Source: Skilled Nursing Facility | Attending: Family | Admitting: Family

## 2024-07-08 DIAGNOSIS — U071 COVID-19: Secondary | ICD-10-CM | POA: Insufficient documentation

## 2024-07-08 LAB — COVID-19, FLU A/B, RSV RAPID BY PCR
INFLUENZA VIRUS TYPE A: NOT DETECTED
INFLUENZA VIRUS TYPE B: NOT DETECTED
RESPIRATORY SYNCTIAL VIRUS (RSV): NOT DETECTED
SARS-CoV-2: DETECTED — AB

## 2024-07-08 LAB — RAPID THROAT SCREEN, STREPTOCOCCUS, WITH REFLEX: THROAT RAPID SCREEN, STREPTOCOCCUS: NEGATIVE

## 2024-07-08 NOTE — ED Provider Notes (Signed)
 [facility] - Emergency Department  ED Primary Note  History of Present Illness   Elizabeth Mathis is a 38 y.o. female who had concerns including Flu Like Symptoms and Covid-19 Positive.       Patient is a 38 year old female to the emergency department complaining of body aches cough rhinorrhea.  Patient states she tested positive at home for COVID several days prior to arrival.  Patient states continuation of symptoms and requesting testing for influenza as well.  Patient denies any shortness breath or chest pain at this time.  Patient has been medicating herself for fever at home with Motrin and Tylenol.    Physical Exam   ED Triage Vitals [07/08/24 1621]   BP (Non-Invasive) 126/86   Heart Rate 93   Respiratory Rate 20   Temperature 36.3 C (97.4 F)   SpO2 97 %   Weight 83.9 kg (185 lb)   Height 1.651 m (5' 5)     Physical Exam  Vitals and nursing note reviewed.   Constitutional:       General: She is not in acute distress.     Appearance: She is well-developed.   HENT:      Head: Normocephalic and atraumatic.   Eyes:      Conjunctiva/sclera: Conjunctivae normal.   Cardiovascular:      Rate and Rhythm: Normal rate and regular rhythm.      Heart sounds: No murmur heard.  Pulmonary:      Effort: Pulmonary effort is normal. No respiratory distress.      Breath sounds: Normal breath sounds.   Abdominal:      Palpations: Abdomen is soft.      Tenderness: There is no abdominal tenderness.   Musculoskeletal:         General: No swelling.      Cervical back: Neck supple.   Skin:     General: Skin is warm and dry.      Capillary Refill: Capillary refill takes less than 2 seconds.   Neurological:      Mental Status: She is alert.   Psychiatric:         Mood and Affect: Mood normal.       Patient Data   Labs Ordered/Reviewed   COVID-19, FLU A/B, RSV RAPID BY PCR - Abnormal; Notable for the following components:       Result Value    SARS-CoV-2 Detected (*)     All other components within normal limits     Narrative:     Results are for the simultaneous qualitative identification of SARS-CoV-2 (formerly 2019-nCoV), Influenza A, Influenza B, and RSV RNA. These etiologic agents are generally detectable in nasopharyngeal and nasal swabs during the ACUTE PHASE of infection. Hence, this test is intended to be performed on respiratory specimens collected from individuals with signs and symptoms of upper respiratory tract infection who meet Centers for Disease Control and Prevention (CDC) clinical and/or epidemiological criteria for Coronavirus Disease 2019 (COVID-19) testing. CDC COVID-19 criteria for testing on human specimens is available at [facility] webpage information for Healthcare Professionals: Coronavirus Disease 2019 (COVID-19) ([employer].com.au).     False-negative results may occur if the virus has genomic mutations, insertions, deletions, or rearrangements or if performed very early in the course of illness. Otherwise, negative results indicate virus specific RNA targets are not detected, however negative results do not preclude SARS-CoV-2 infection/COVID-19, Influenza, or Respiratory syncytial virus infection. Results should not be used as the sole basis for patient management decisions.  Negative results must be combined with clinical observations, patient history, and epidemiological information. If upper respiratory tract infection is still suspected based on exposure history together with other clinical findings, re-testing should be considered.    Test methodology:   Cepheid Xpert Xpress SARS-CoV-2/Flu/RSV Assay real-time polymerase chain reaction (RT-PCR) test on the GeneXpert Dx and Xpert Xpress systems.   RAPID THROAT SCREEN, STREPTOCOCCUS, WITH REFLEX - Normal    Narrative:     Walk Away Mode   THROAT CULTURE, BETA HEMOLYTIC STREPTOCOCCUS     No orders to display     Medical Decision Making        Medical Decision Making  Patient is a 38 year old female to the  emergency department complaining of body aches cough rhinorrhea.  Patient states she tested positive at home for COVID several days prior to arrival.  Patient states continuation of symptoms and requesting testing for influenza as well.  Patient denies any shortness breath or chest pain at this time.  Patient has been medicating herself for fever at home with Motrin and Tylenol.    Differential diagnosis include but are not limited to COVID, flu, RSV, pneumonia, viral syndrome.  On physical examination lung fields are clear.  Patient is afebrile in the emergency department today.  Vital signs are stable.  COVID flu RSV returned positive for COVID.  Patient is to continue containing at this time as she has fever at home and treat symptomatically.                   Clinical Impression   COVID (Primary)       Disposition: Discharged

## 2024-07-08 NOTE — ED APP Handoff Note (Signed)
 [facility] - Emergency Department  Emergency Department  Provider in Triage Note    Name: Elizabeth Mathis  Age: 38 y.o.  Gender: female     Subjective:   Elizabeth Mathis is a 38 y.o. female who presents with complaint of Flu Like Symptoms and Covid-19 Positive  .  Positive for COVID test at home on Christmas that has symptoms persisted patient concerned she has the flu.    Objective:   There were no vitals filed for this visit.   Vitals are also documented in the EMR.  Focused Physical Exam shows no acute distress    Assessment:  A medical screening exam was completed.  This patient is a 38 y.o. female with Flu Like Symptoms and Covid-19 Positive  .    Plan:  Please see initial orders and work-up in the EMR.  This is to be continued with full evaluation in the main Emergency Department.     No current facility-administered medications for this encounter.     No results found for this or any previous visit (from the past 24 hours).       THEOPHILUS Elbe FNP-C   07/08/2024, 16:21   Department of Emergency Medicine  [facility]

## 2024-07-08 NOTE — ED Triage Notes (Signed)
 Pt reports that she has had a fever since Christmas, tested positive for COVID with a home test. Pt concerned she also has flu at this time. Pt reports body aches, chills, hot flashes, sore throat, cough, runny nose, ear pressure, and nausea

## 2024-07-08 NOTE — ED Nurses Note (Signed)
 A full assessment was not completed by this nurse. The patient was seen and discharged from the waiting room. Patient discharged home with family.  AVS reviewed with patient/care giver.  A written copy of the AVS and discharge instructions was given to the patient/care giver.  Questions sufficiently answered as needed.  Patient/care giver encouraged to follow up with PCP as indicated.  In the event of an emergency, patient/care giver instructed to call 911 or go to the nearest emergency room.

## 2024-07-09 ENCOUNTER — Encounter (INDEPENDENT_AMBULATORY_CARE_PROVIDER_SITE_OTHER): Payer: Self-pay | Admitting: Physician Assistant

## 2024-07-10 LAB — THROAT CULTURE, BETA HEMOLYTIC STREPTOCOCCUS: THROAT CULTURE: NORMAL

## 2024-07-30 ENCOUNTER — Encounter (INDEPENDENT_AMBULATORY_CARE_PROVIDER_SITE_OTHER): Payer: Self-pay | Admitting: Physician Assistant

## 2024-07-30 ENCOUNTER — Other Ambulatory Visit: Payer: Self-pay

## 2024-07-30 ENCOUNTER — Ambulatory Visit (INDEPENDENT_AMBULATORY_CARE_PROVIDER_SITE_OTHER): Payer: Self-pay | Admitting: Physician Assistant

## 2024-07-30 VITALS — BP 117/90 | HR 93 | Ht 65.0 in | Wt 183.0 lb

## 2024-07-30 DIAGNOSIS — R3989 Other symptoms and signs involving the genitourinary system: Secondary | ICD-10-CM

## 2024-07-30 DIAGNOSIS — N816 Rectocele: Secondary | ICD-10-CM

## 2024-07-30 DIAGNOSIS — K5909 Other constipation: Secondary | ICD-10-CM

## 2024-07-30 DIAGNOSIS — R102 Pelvic and perineal pain unspecified side: Secondary | ICD-10-CM

## 2024-07-30 DIAGNOSIS — N8110 Cystocele, unspecified: Secondary | ICD-10-CM

## 2024-07-30 DIAGNOSIS — N368 Other specified disorders of urethra: Secondary | ICD-10-CM

## 2024-07-30 MED ORDER — MIRABEGRON ER 25 MG TABLET,EXTENDED RELEASE 24 HR: 25.0000 mg | ORAL_TABLET | Freq: Every day | ORAL | 0 refills | Status: AC

## 2024-07-30 NOTE — Progress Notes (Unsigned)
 [facility]                                                                        Urology Clinic       Elizabeth Mathis, 39 y.o. female  Encounter Start Date:  (Not on file)  Date of Service:  07/30/2024  Date of Birth:  [birth year]    PCP: Arland MATSU Southers, PA-C.    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 She reports constant urethral burning in November 2025 with radiation to left 2nd toe. 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      Patient denies personal or family history of urinary malignancy. Patient denies occupational chemical exposure.Patient denies history of tobacco use.             Urine POCT               Gen: NAD, alert  Pulm: unlabored at rest  CV: palpable pulses  Abd: soft, Nt/ND  GU: no suprapubic tenderness, no CVAT    Impression:   Urethral pain  History of Chron's disease  Chronic constipation  Cystocele and rectocele      Recommendations:    Referral for PFT  Discussed anticholinergic, deferred at this time due to constipation  Trial of Mirabegron  25 mg po daily  Micro UA  Referral to Urogyn  Discussed cystoscopy which she opts to wait for visit with Urogyn             A combined total of  45 minutes were spent preparing to see the patient, reviewing previous records, ordering tests/medications/procedures, documenting the clinical encounter as well as performing a medically appropriate evaluation and independently interpreting results and communicating them to the  patient/family/caregiver as specifically outlined above in the impression and plan.    Jomarion Mish, PA-C

## 2024-07-30 NOTE — Progress Notes (Signed)
 error

## 2024-08-02 NOTE — Addendum Note (Signed)
 Addended by: WASH ALMARIE BROCKS on: 08/02/2024 01:19 PM     Modules accepted: Orders

## 2024-08-08 ENCOUNTER — Telehealth (INDEPENDENT_AMBULATORY_CARE_PROVIDER_SITE_OTHER): Payer: Self-pay | Admitting: Obstetrics & Gynecology

## 2024-08-08 ENCOUNTER — Ambulatory Visit

## 2024-08-08 ENCOUNTER — Other Ambulatory Visit: Payer: Self-pay

## 2024-08-08 DIAGNOSIS — E66811 Obesity, class 1: Secondary | ICD-10-CM | POA: Insufficient documentation

## 2024-08-08 DIAGNOSIS — K5010 Crohn's disease of large intestine without complications: Secondary | ICD-10-CM | POA: Insufficient documentation

## 2024-08-08 DIAGNOSIS — E538 Deficiency of other specified B group vitamins: Secondary | ICD-10-CM | POA: Insufficient documentation

## 2024-08-08 DIAGNOSIS — R599 Enlarged lymph nodes, unspecified: Secondary | ICD-10-CM | POA: Insufficient documentation

## 2024-08-08 DIAGNOSIS — E559 Vitamin D deficiency, unspecified: Secondary | ICD-10-CM | POA: Insufficient documentation

## 2024-08-08 LAB — COMPREHENSIVE METABOLIC PANEL, NON-FASTING
ALBUMIN/GLOBULIN RATIO: 1.3 (ref 0.8–1.4)
ALBUMIN: 4.4 g/dL (ref 3.5–5.7)
ALKALINE PHOSPHATASE: 68 U/L (ref 34–104)
ALT (SGPT): 12 U/L (ref 7–52)
ANION GAP: 8 mmol/L (ref 4–13)
AST (SGOT): 14 U/L (ref 13–39)
BILIRUBIN TOTAL: 0.5 mg/dL (ref 0.3–1.0)
BUN/CREA RATIO: 13 (ref 6–22)
BUN: 12 mg/dL (ref 7–25)
CALCIUM, CORRECTED: 9.2 mg/dL (ref 8.9–10.8)
CALCIUM: 9.5 mg/dL (ref 8.6–10.3)
CHLORIDE: 102 mmol/L (ref 98–107)
CO2 TOTAL: 28 mmol/L (ref 21–31)
CREATININE: 0.89 mg/dL (ref 0.60–1.30)
ESTIMATED GFR: 84 mL/min/{1.73_m2} (ref >59)
GLOBULIN: 3.4 (ref 2.0–3.5)
GLUCOSE: 85 mg/dL (ref 74–109)
OSMOLALITY, CALCULATED: 275 mosm/kg (ref 270–290)
POTASSIUM: 3.9 mmol/L (ref 3.5–5.1)
PROTEIN TOTAL: 7.8 g/dL (ref 6.4–8.9)
SODIUM: 138 mmol/L (ref 136–145)

## 2024-08-08 LAB — CBC
HCT: 41.3 % (ref 31.2–41.9)
HGB: 14.4 g/dL — ABNORMAL HIGH (ref 10.9–14.3)
MCH: 31.5 pg (ref 24.7–32.8)
MCHC: 34.8 g/dL (ref 32.3–35.6)
MCV: 90.5 fL (ref 75.5–95.3)
MPV: 7.5 fL — ABNORMAL LOW (ref 7.9–10.8)
PLATELETS: 274 10*3/uL (ref 140–440)
RBC: 4.57 10*6/uL (ref 3.63–4.92)
RDW: 12.5 % (ref 12.3–17.7)
WBC: 6.6 10*3/uL (ref 3.8–11.8)

## 2024-08-08 LAB — LIPID PANEL
CHOL/HDL RATIO: 3.3
CHOLESTEROL: 206 mg/dL — ABNORMAL HIGH (ref <200)
HDL CHOL: 63 mg/dL (ref >=40)
LDL CALC: 116 mg/dL — ABNORMAL HIGH (ref 0–100)
TRIGLYCERIDES: 136 mg/dL (ref <=150)
VLDL CALC: 27 mg/dL (ref 0–50)

## 2024-08-08 LAB — THYROID STIMULATING HORMONE WITH FREE T4 REFLEX: TSH: 2.838 u[IU]/mL (ref 0.450–5.330)

## 2024-08-08 LAB — VITAMIN B12: VITAMIN B 12: 289 pg/mL (ref 180–914)

## 2024-08-08 LAB — VITAMIN D 25 TOTAL: VITAMIN D 25, TOTAL: 17.19 ng/mL — ABNORMAL LOW (ref 30.00–100.00)

## 2024-08-08 NOTE — Telephone Encounter (Signed)
 Left voicemail for patient re: referral from Brittany Addair in workqueue (08/08/24)

## 2024-08-13 ENCOUNTER — Ambulatory Visit: Admission: RE | Admit: 2024-08-13 | Discharge: 2024-08-13 | Disposition: A | Source: Ambulatory Visit

## 2024-08-13 ENCOUNTER — Other Ambulatory Visit: Payer: Self-pay

## 2024-08-13 ENCOUNTER — Other Ambulatory Visit (HOSPITAL_COMMUNITY): Payer: Self-pay

## 2024-08-13 DIAGNOSIS — S3992XA Unspecified injury of lower back, initial encounter: Secondary | ICD-10-CM | POA: Insufficient documentation

## 2024-09-04 ENCOUNTER — Ambulatory Visit (INDEPENDENT_AMBULATORY_CARE_PROVIDER_SITE_OTHER): Payer: Self-pay | Admitting: Obstetrics & Gynecology

## 2025-01-29 ENCOUNTER — Encounter (INDEPENDENT_AMBULATORY_CARE_PROVIDER_SITE_OTHER): Payer: Self-pay | Admitting: Physician Assistant
